# Patient Record
Sex: Female | Born: 1995 | Hispanic: Yes | Marital: Married | Smoking: Never smoker
Health system: Southern US, Community
[De-identification: ages and names within clinical notes are randomized; demographics above are authoritative.]

## PROBLEM LIST (undated history)

## (undated) DIAGNOSIS — J45909 Unspecified asthma, uncomplicated: Secondary | ICD-10-CM

---
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
## 2015-02-03 ENCOUNTER — Encounter (HOSPITAL_BASED_OUTPATIENT_CLINIC_OR_DEPARTMENT_OTHER): Payer: Self-pay | Admitting: *Deleted

## 2015-02-03 ENCOUNTER — Emergency Department (HOSPITAL_BASED_OUTPATIENT_CLINIC_OR_DEPARTMENT_OTHER)
Admission: EM | Admit: 2015-02-03 | Discharge: 2015-02-03 | Disposition: A | Discharge status: Home / Self Care | Payer: Medicaid Other | Attending: Emergency Medicine | Admitting: Emergency Medicine

## 2015-02-03 DIAGNOSIS — O23591 Infection of other part of genital tract in pregnancy, first trimester: Secondary | ICD-10-CM | POA: Diagnosis not present

## 2015-02-03 DIAGNOSIS — Z79899 Other long term (current) drug therapy: Secondary | ICD-10-CM | POA: Insufficient documentation

## 2015-02-03 DIAGNOSIS — O209 Hemorrhage in early pregnancy, unspecified: Secondary | ICD-10-CM | POA: Diagnosis present

## 2015-02-03 DIAGNOSIS — Z3A01 Less than 8 weeks gestation of pregnancy: Secondary | ICD-10-CM | POA: Insufficient documentation

## 2015-02-03 DIAGNOSIS — O200 Threatened abortion: Secondary | ICD-10-CM | POA: Insufficient documentation

## 2015-02-03 DIAGNOSIS — B9689 Other specified bacterial agents as the cause of diseases classified elsewhere: Secondary | ICD-10-CM

## 2015-02-03 DIAGNOSIS — N760 Acute vaginitis: Secondary | ICD-10-CM

## 2015-02-03 DIAGNOSIS — J45909 Unspecified asthma, uncomplicated: Secondary | ICD-10-CM | POA: Insufficient documentation

## 2015-02-03 DIAGNOSIS — O99511 Diseases of the respiratory system complicating pregnancy, first trimester: Secondary | ICD-10-CM | POA: Diagnosis not present

## 2015-02-03 LAB — WET PREP, GENITAL
Trich, Wet Prep: NONE SEEN
YEAST WET PREP: NONE SEEN

## 2015-02-03 LAB — URINE MICROSCOPIC-ADD ON

## 2015-02-03 LAB — URINALYSIS, ROUTINE W REFLEX MICROSCOPIC
Bilirubin Urine: NEGATIVE
Glucose, UA: NEGATIVE mg/dL
KETONES UR: NEGATIVE mg/dL
NITRITE: NEGATIVE
PROTEIN: NEGATIVE mg/dL
SPECIFIC GRAVITY, URINE: 1.018 (ref 1.005–1.030)
Urobilinogen, UA: 1 mg/dL (ref 0.0–1.0)
pH: 7 (ref 5.0–8.0)

## 2015-02-03 LAB — HCG, QUANTITATIVE, PREGNANCY: hCG, Beta Chain, Quant, S: 52843 m[IU]/mL — ABNORMAL HIGH (ref <5)

## 2015-02-03 LAB — PREGNANCY, URINE: PREG TEST UR: POSITIVE — AB

## 2015-02-03 MED ORDER — METRONIDAZOLE 500 MG PO TABS: 500.0000 mg | ORAL_TABLET | Freq: Two times a day (BID) | ORAL | Status: DC

## 2015-02-03 NOTE — ED Provider Notes (Signed)
CSN: [id number]     Arrival date & time 02/03/15  2014 History   First MD Initiated Contact with Patient 02/03/15 2024     Chief Complaint  Patient presents with  . Hematuria     (Consider location/radiation/quality/duration/timing/severity/associated sxs/prior Treatment) HPI Comments: 19 y/o G2P1 F c/o vaginal bleeding beginning at 5 PM today. Reports after urinating, when she wiped, she noticed a bright red clot on the toilet tissue. Shortly after, she took a shower, urinated again, noticed a darker clot on the toilet tissue. There has been no further vaginal bleeding, and it has only been noted when she wipes. LMP 12/22/2014. Reports she is approximately [redacted] weeks pregnant, took two home pregnancy tests and one at OB/GYN. Denies abdominal pain, fever, chills, nausea or vomiting.  The history is provided by the patient.    Past Medical History  Diagnosis Date  . Asthma   . Pregnancy    History reviewed. No pertinent past surgical history. No family history on file. History  Substance Use Topics  . Smoking status: Never Smoker   . Smokeless tobacco: Not on file  . Alcohol Use: No   OB History    Gravida Para Term Preterm AB TAB SAB Ectopic Multiple Living   1              Review of Systems  Genitourinary: Positive for vaginal bleeding.  All other systems reviewed and are negative.     Allergies  Review of patient's allergies indicates no known allergies.  Home Medications   Prior to Admission medications   Medication Sig Start Date End Date Taking? Authorizing Provider  acetaminophen (TYLENOL) 500 MG tablet Take 1 tablet (500 mg total) by mouth every 6 (six) hours as needed for pain. 03/11/13   Marcellina Millin, MD  albuterol (PROVENTIL HFA;VENTOLIN HFA) 108 (90 BASE) MCG/ACT inhaler Inhale 2 puffs into the lungs every 6 (six) hours as needed for wheezing.    Historical Provider, MD  metroNIDAZOLE (FLAGYL) 500 MG tablet Take 1 tablet (500 mg total) by mouth 2 (two) times  daily. One po bid x 7 days 02/03/15   Kathrynn Speed, PA-C  Prenatal Vit-Fe Fumarate-FA (PRENATAL MULTIVITAMIN) TABS Take 1 tablet by mouth daily at 12 noon.    Historical Provider, MD   BP 110/56 mmHg  Pulse 88  Temp(Src) 98.7 F (37.1 C) (Oral)  Resp 20  Ht  (1.575 m)  Wt 197 lb (89.359 kg)  BMI 36.02 kg/m2  SpO2 100% Physical Exam  Constitutional: She is oriented to person, place, and time. She appears well-developed and well-nourished. No distress.  HENT:  Head: Normocephalic and atraumatic.  Mouth/Throat: Oropharynx is clear and moist.  Eyes: Conjunctivae and EOM are normal.  Neck: Normal range of motion. Neck supple.  Cardiovascular: Normal rate, regular rhythm and normal heart sounds.   Pulmonary/Chest: Effort normal and breath sounds normal. No respiratory distress.  Abdominal: Soft. Bowel sounds are normal. She exhibits no distension. There is no tenderness.  Genitourinary: Cervix exhibits no motion tenderness. Right adnexum displays no tenderness. Left adnexum displays no tenderness.  Very small amount of dark red blood in vaginal vault. Cervical os closed.  Musculoskeletal: Normal range of motion. She exhibits no edema.  Neurological: She is alert and oriented to person, place, and time. No sensory deficit.  Skin: Skin is warm and dry.  Psychiatric: She has a normal mood and affect. Her behavior is normal.  Nursing note and vitals reviewed.   ED Course  Procedures (including critical care time) Labs Review Labs Reviewed  WET PREP, GENITAL - Abnormal; Notable for the following:    Clue Cells Wet Prep HPF POC MODERATE (*)    WBC, Wet Prep HPF POC MODERATE (*)    All other components within normal limits  URINALYSIS, ROUTINE W REFLEX MICROSCOPIC - Abnormal; Notable for the following:    Hgb urine dipstick MODERATE (*)    Leukocytes, UA MODERATE (*)    All other components within normal limits  PREGNANCY, URINE - Abnormal; Notable for the following:    Preg Test,  Ur POSITIVE (*)    All other components within normal limits  URINE MICROSCOPIC-ADD ON - Abnormal; Notable for the following:    Bacteria, UA FEW (*)    All other components within normal limits  URINE CULTURE  HCG, QUANTITATIVE, PREGNANCY  GC/CHLAMYDIA PROBE AMP ([place])    Imaging Review No results found.   EKG Interpretation None      MDM   Final diagnoses:  Threatened abortion  BV (bacterial vaginosis)   Non-toxic appearing, NAD. AFVSS. No associated abdominal pain. Abdomen soft and non-tender. Two episodes of small amount of blood. On exam, small amount of blood noted. Cervical os closed. Approx [redacted] weeks pregnant. Wet prep significant for clue cells. This is most likely the cause of WBC and leukocytes in urine. Urine culture pending. Will treat BV with flagyl. Regarding vaginal bleeding, pt will need to f/u with her ob/gyn for US, as at 4 weeks, most likely will not be conclusive. Discussed with Dr. Deretha EmoryZackowski who agrees. Discussed with pt that this is possibly a miscarriage, however bleeding can be coming from infection. Stable for d/c. Return precautions given. Patient states understanding of treatment care plan and is agreeable.  Kathrynn SpeedRobyn M Zayneb Baucum, PA-C 02/03/15 2139  Vanetta MuldersScott Zackowski, MD 02/07/15 [phone]

## 2015-02-03 NOTE — Discharge Instructions (Signed)
Take flagyl twice daily for 7 days. Continue prenatal vitamins. Call your OB/GYN tomorrow to schedule an appointment as soon as possible.  Bacterial Vaginosis Bacterial vaginosis is a vaginal infection that occurs when the normal balance of bacteria in the vagina is disrupted. It results from an overgrowth of certain bacteria. This is the most common vaginal infection in women of childbearing age. Treatment is important to prevent complications, especially in pregnant women, as it can cause a premature delivery. CAUSES  Bacterial vaginosis is caused by an increase in harmful bacteria that are normally present in smaller amounts in the vagina. Several different kinds of bacteria can cause bacterial vaginosis. However, the reason that the condition develops is not fully understood. RISK FACTORS Certain activities or behaviors can put you at an increased risk of developing bacterial vaginosis, including:  Having a new sex partner or multiple sex partners.  Douching.  Using an intrauterine device (IUD) for contraception. Women do not get bacterial vaginosis from toilet seats, bedding, swimming pools, or contact with objects around them. SIGNS AND SYMPTOMS  Some women with bacterial vaginosis have no signs or symptoms. Common symptoms include:  Grey vaginal discharge.  A fishlike odor with discharge, especially after sexual intercourse.  Itching or burning of the vagina and vulva.  Burning or pain with urination. DIAGNOSIS  Your health care provider will take a medical history and examine the vagina for signs of bacterial vaginosis. A sample of vaginal fluid may be taken. Your health care provider will look at this sample under a microscope to check for bacteria and abnormal cells. A vaginal pH test may also be done.  TREATMENT  Bacterial vaginosis may be treated with antibiotic medicines. These may be given in the form of a pill or a vaginal cream. A second round of antibiotics may be  prescribed if the condition comes back after treatment.  HOME CARE INSTRUCTIONS   Only take over-the-counter or prescription medicines as directed by your health care provider.  If antibiotic medicine was prescribed, take it as directed. Make sure you finish it even if you start to feel better.  Do not have sex until treatment is completed.  Tell all sexual partners that you have a vaginal infection. They should see their health care provider and be treated if they have problems, such as a mild rash or itching.  Practice safe sex by using condoms and only having one sex partner. SEEK MEDICAL CARE IF:   Your symptoms are not improving after 3 days of treatment.  You have increased discharge or pain.  You have a fever. MAKE SURE YOU:   Understand these instructions.  Will watch your condition.  Will get help right away if you are not doing well or get worse. FOR MORE INFORMATION  Centers for Disease Control and Prevention, Division of STD Prevention: [employer]s.co.za American Sexual Health Association (ASHA): www.ashastd.org  Document Released: 08/29/2005 Document Revised: 06/19/2013 Document Reviewed: 04/10/2013 [facility] Patient Information 2015 [place], [place]. This information is not intended to replace advice given to you by your health care provider. Make sure you discuss any questions you have with your health care provider.  Threatened Miscarriage A threatened miscarriage occurs when you have vaginal bleeding during your first 20 weeks of pregnancy but the pregnancy has not ended. If you have vaginal bleeding during this time, your health care provider will do tests to make sure you are still pregnant. If the tests show you are still pregnant and the developing baby (fetus) inside your  womb (uterus) is still growing, your condition is considered a threatened miscarriage. A threatened miscarriage does not mean your pregnancy will end, but it does increase the risk of losing your  pregnancy (complete miscarriage). CAUSES  The cause of a threatened miscarriage is usually not known. If you go on to have a complete miscarriage, the most common cause is an abnormal number of chromosomes in the developing baby. Chromosomes are the structures inside cells that hold all your genetic material. Some causes of vaginal bleeding that do not result in miscarriage include:  Having sex.  Having an infection.  Normal hormone changes of pregnancy.  Bleeding that occurs when an egg implants in your uterus. RISK FACTORS Risk factors for bleeding in early pregnancy include:  Obesity.  Smoking.  Drinking excessive amounts of alcohol or caffeine.  Recreational drug use. SIGNS AND SYMPTOMS  Light vaginal bleeding.  Mild abdominal pain or cramps. DIAGNOSIS  If you have bleeding with or without abdominal pain before 20 weeks of pregnancy, your health care provider will do tests to check whether you are still pregnant. One important test involves using sound waves and a computer (ultrasound) to create images of the inside of your uterus. Other tests include an internal exam of your vagina and uterus (pelvic exam) and measurement of your baby's heart rate.  You may be diagnosed with a threatened miscarriage if:  Ultrasound testing shows you are still pregnant.  Your baby's heart rate is strong.  A pelvic exam shows that the opening between your uterus and your vagina (cervix) is closed.  Your heart rate and blood pressure are stable.  Blood tests confirm you are still pregnant. TREATMENT  No treatments have been shown to prevent a threatened miscarriage from going on to a complete miscarriage. However, the right home care is important.  HOME CARE INSTRUCTIONS   Make sure you keep all your appointments for prenatal care. This is very important.  Get plenty of rest.  Do not have sex or use tampons if you have vaginal bleeding.  Do not douche.  Do not smoke or use  recreational drugs.  Do not drink alcohol.  Avoid caffeine. SEEK MEDICAL CARE IF:  You have light vaginal bleeding or spotting while pregnant.  You have abdominal pain or cramping.  You have a fever. SEEK IMMEDIATE MEDICAL CARE IF:  You have heavy vaginal bleeding.  You have blood clots coming from your vagina.  You have severe low back pain or abdominal cramps.  You have fever, chills, and severe abdominal pain. MAKE SURE YOU:  Understand these instructions.  Will watch your condition.  Will get help right away if you are not doing well or get worse. Document Released: 08/29/2005 Document Revised: 09/03/2013 Document Reviewed: 06/25/2013 [facility] Patient Information 2015 [place]. This information is not intended to replace advice given to you by your health care provider. Make sure you discuss any questions you have with your health care provider.

## 2015-02-03 NOTE — ED Notes (Signed)
Pt a/o ambulated to room, no acute distress noted. Hematuria starting today

## 2015-02-03 NOTE — ED Notes (Signed)
Hematuria this afternoon.

## 2015-02-04 LAB — GC/CHLAMYDIA PROBE AMP (~~LOC~~) NOT AT ARMC
Chlamydia: NEGATIVE
Neisseria Gonorrhea: NEGATIVE

## 2015-02-04 LAB — URINE CULTURE
COLONY COUNT: NO GROWTH
Culture: NO GROWTH

## 2016-02-03 ENCOUNTER — Other Ambulatory Visit: Payer: Self-pay | Admitting: Allergy

## 2016-02-03 MED ORDER — ALBUTEROL SULFATE HFA 108 (90 BASE) MCG/ACT IN AERS: 2.0000 | INHALATION_SPRAY | RESPIRATORY_TRACT | Status: DC | PRN

## 2016-02-20 ENCOUNTER — Encounter (HOSPITAL_BASED_OUTPATIENT_CLINIC_OR_DEPARTMENT_OTHER): Payer: Self-pay | Admitting: *Deleted

## 2016-02-20 ENCOUNTER — Emergency Department (HOSPITAL_BASED_OUTPATIENT_CLINIC_OR_DEPARTMENT_OTHER): Payer: Medicaid Other

## 2016-02-20 ENCOUNTER — Emergency Department (HOSPITAL_BASED_OUTPATIENT_CLINIC_OR_DEPARTMENT_OTHER)
Admission: EM | Admit: 2016-02-20 | Discharge: 2016-02-20 | Disposition: A | Discharge status: Home / Self Care | Payer: Medicaid Other | Attending: Emergency Medicine | Admitting: Emergency Medicine

## 2016-02-20 DIAGNOSIS — J45909 Unspecified asthma, uncomplicated: Secondary | ICD-10-CM | POA: Diagnosis not present

## 2016-02-20 DIAGNOSIS — R0602 Shortness of breath: Secondary | ICD-10-CM | POA: Insufficient documentation

## 2016-02-20 MED ORDER — ALBUTEROL SULFATE HFA 108 (90 BASE) MCG/ACT IN AERS
2.0000 | INHALATION_SPRAY | Freq: Once | RESPIRATORY_TRACT | Status: AC
Start: 2016-02-20 — End: 2016-02-20
  Administered 2016-02-20: 2 via RESPIRATORY_TRACT
  Filled 2016-02-20: qty 6.7

## 2016-02-20 MED ORDER — IPRATROPIUM-ALBUTEROL 0.5-2.5 (3) MG/3ML IN SOLN
3.0000 mL | RESPIRATORY_TRACT | Status: DC
  Administered 2016-02-20: 3 mL via RESPIRATORY_TRACT
  Filled 2016-02-20: qty 3

## 2016-02-20 NOTE — Discharge Instructions (Signed)
Shortness of Breath Ms. Kerri Patrick, your chest xray and EKG were normal today.  See your primary care doctor within 3 days for close follow up.  Use albuterol inhaler as needed. If symptoms worsen, come back to the ED immediately. Thank you. Shortness of breath means you have trouble breathing. Shortness of breath needs medical care right away. HOME CARE   Do not smoke.  Avoid being around chemicals or things (paint fumes, dust) that may bother your breathing.  Rest as needed. Slowly begin your normal activities.  Only take medicines as told by your doctor.  Keep all doctor visits as told. GET HELP RIGHT AWAY IF:   Your shortness of breath gets worse.  You feel lightheaded, pass out (faint), or have a cough that is not helped by medicine.  You cough up blood.  You have pain with breathing.  You have pain in your chest, arms, shoulders, or belly (abdomen).  You have a fever.  You cannot walk up stairs or exercise the way you normally do.  You do not get better in the time expected.  You have a hard time doing normal activities even with rest.  You have problems with your medicines.  You have any new symptoms. MAKE SURE YOU:  Understand these instructions.  Will watch your condition.  Will get help right away if you are not doing well or get worse.   This information is not intended to replace advice given to you by your health care provider. Make sure you discuss any questions you have with your health care provider.   Document Released: 02/15/2008 Document Revised: 09/03/2013 Document Reviewed: 11/14/2011 Elsevier Interactive Patient Education [employer]2016 Elsevier Inc.

## 2016-02-20 NOTE — ED Provider Notes (Signed)
CSN: [id number]     Arrival date & time 02/20/16  0037 History   First MD Initiated Contact with Patient 02/20/16 0139     Chief Complaint  Patient presents with  . Shortness of Breath     (Consider location/radiation/quality/duration/timing/severity/associated sxs/prior Treatment) HPI   Kerri Patrick is a 20 y.o. female with past medical history of asthma presenting today for shortness of breath. Patient states this is been going on for the past 3-4 days. She describes worsening shortness of breath when she stands up. She denies any pain anywhere. She denies any recent infections, fever, rhinorrhea, or cough. She does have 2 children home but no sick contacts. She states her albuterol inhaler does not feel like it is working anymore. She denies any history of blood clots, lower extremity swelling or pain, long distance travel, estrogen use, recent surgeries. There are no further complaints.  10 Systems reviewed and are negative for acute change except as noted in the HPI.     Past Medical History  Diagnosis Date  . Asthma   . Pregnancy    History reviewed. No pertinent past surgical history. History reviewed. No pertinent family history. Social History  Substance Use Topics  . Smoking status: Never Smoker   . Smokeless tobacco: None  . Alcohol Use: No   OB History    Gravida Para Term Preterm AB TAB SAB Ectopic Multiple Living   1              Review of Systems    Allergies  Review of patient's allergies indicates no known allergies.  Home Medications   Prior to Admission medications   Medication Sig Start Date End Date Taking? Authorizing Provider  albuterol (PROAIR HFA) 108 (90 Base) MCG/ACT inhaler Inhale 2 puffs into the lungs every 4 (four) hours as needed for wheezing or shortness of breath. 02/03/16   Fletcher Anon, MD  albuterol (PROVENTIL HFA;VENTOLIN HFA) 108 (90 BASE) MCG/ACT inhaler Inhale 2 puffs into the lungs every 6 (six) hours as needed for  wheezing.    Historical Provider, MD   BP 117/71 mmHg  Pulse 75  Temp(Src) 97.8 F (36.6 C) (Oral)  Resp 18  Ht  (1.575 m)  Wt 185 lb (83.915 kg)  BMI 33.83 kg/m2  SpO2 100%  LMP 02/11/2016  Breastfeeding? Unknown Physical Exam  Constitutional: She is oriented to person, place, and time. She appears well-developed and well-nourished. No distress.  HENT:  Head: Normocephalic and atraumatic.  Nose: Nose normal.  Mouth/Throat: Oropharynx is clear and moist. No oropharyngeal exudate.  Eyes: Conjunctivae and EOM are normal. Pupils are equal, round, and reactive to light. No scleral icterus.  Neck: Normal range of motion. Neck supple. No JVD present. No tracheal deviation present. No thyromegaly present.  Cardiovascular: Normal rate, regular rhythm and normal heart sounds.  Exam reveals no gallop and no friction rub.   No murmur heard. Pulmonary/Chest: Effort normal and breath sounds normal. No respiratory distress. She has no wheezes. She exhibits no tenderness.  Abdominal: Soft. Bowel sounds are normal. She exhibits no distension and no mass. There is no tenderness. There is no rebound and no guarding.  Musculoskeletal: Normal range of motion. She exhibits no edema or tenderness.  Lymphadenopathy:    She has no cervical adenopathy.  Neurological: She is alert and oriented to person, place, and time. No cranial nerve deficit. She exhibits normal muscle tone.  Skin: Skin is warm and dry. No rash noted. No erythema. No  pallor.  Nursing note and vitals reviewed.   ED Course  Procedures (including critical care time) Labs Review Labs Reviewed - No data to display  Imaging Review Dg Chest 2 View  02/20/2016  CLINICAL DATA:  Acute onset of shortness of breath. Initial encounter. EXAM: CHEST  2 VIEW COMPARISON:  Chest radiograph performed 12/30/2012 FINDINGS: The lungs are well-aerated. Mild vascular congestion is noted. Mild bibasilar atelectasis is seen. There is no evidence of  pleural effusion or pneumothorax. The heart is normal in size; the mediastinal contour is within normal limits. No acute osseous abnormalities are seen. IMPRESSION: Mild vascular congestion noted.  Mild bibasilar atelectasis seen. Electronically Signed   By: Roanna RaiderJeffery  Chang M.D.   On: 02/20/2016 02:56   I have personally reviewed and evaluated these images and lab results as part of my medical decision-making.   EKG Interpretation   Date/Time:  Saturday February 20 2016 02:10:01 EDT Ventricular Rate:  67 PR Interval:  174 QRS Duration: 86 QT Interval:  436 QTC Calculation: 460 R Axis:   14 Text Interpretation:  Sinus rhythm RSR' in V1 or V2, probably normal  variant No old tracing to compare Confirmed by Erroll Lunani, Hetal Proano Ayokunle  [phone]([id number]) on 02/20/2016 2:33:23 AM      MDM   Final diagnoses:  SOB (shortness of breath)    Patient presents emergency department for shortness of breath. Chest x-ray does not show any cause for her symptoms. EKG is unremarkable. She was given DuoNeb treatment in the emergency department for her symptoms.   3:02 AM upon repeat evaluation, patient states his shortness of breath has improved. Will discharge home with new albuterol inhaler. She appears well and in no acute distress, vital signs within her normal limits and she is safe for discharge and primary care follow-up within 3 days.    Tomasita CrumbleAdeleke Francys Bolin, MD 02/20/16 [phone]

## 2016-02-20 NOTE — ED Notes (Signed)
Pt c/o SOB x 3 days  hx asthma

## 2016-06-05 ENCOUNTER — Encounter (HOSPITAL_BASED_OUTPATIENT_CLINIC_OR_DEPARTMENT_OTHER): Payer: Self-pay | Admitting: Emergency Medicine

## 2016-06-05 ENCOUNTER — Emergency Department (HOSPITAL_BASED_OUTPATIENT_CLINIC_OR_DEPARTMENT_OTHER)
Admission: EM | Admit: 2016-06-05 | Discharge: 2016-06-05 | Disposition: A | Discharge status: Home / Self Care | Payer: Medicaid Other | Attending: Emergency Medicine | Admitting: Emergency Medicine

## 2016-06-05 DIAGNOSIS — J45909 Unspecified asthma, uncomplicated: Secondary | ICD-10-CM | POA: Insufficient documentation

## 2016-06-05 DIAGNOSIS — R0602 Shortness of breath: Secondary | ICD-10-CM | POA: Diagnosis not present

## 2016-06-05 DIAGNOSIS — R079 Chest pain, unspecified: Secondary | ICD-10-CM | POA: Diagnosis present

## 2016-06-05 DIAGNOSIS — R1010 Upper abdominal pain, unspecified: Secondary | ICD-10-CM | POA: Insufficient documentation

## 2016-06-05 DIAGNOSIS — Z7951 Long term (current) use of inhaled steroids: Secondary | ICD-10-CM | POA: Insufficient documentation

## 2016-06-05 NOTE — Discharge Instructions (Signed)
Summary this pain could be biliary colic. Follow-up with the surgeon as needed. Return for continued pain or pain that localizes to the right upper abdomen.

## 2016-06-05 NOTE — ED Provider Notes (Signed)
[facility]   CSN: [id number] Arrival date & time: 06/05/16  1528  By signing my name below, I, Phillis Haggis, attest that this documentation has been prepared under the direction and in the presence of Benjiman Core, MD. Electronically Signed: Phillis Haggis, ED Scribe. 06/05/16. 4:48 PM.  History   Chief Complaint Chief Complaint  Patient presents with  . Chest Pain    x1 week   The history is provided by the patient. No language interpreter was used.   HPI Comments: Scheryl Sanborn is a 20 y.o. female with a hx of asthma who presents to the Emergency Department complaining of intermittent, sharp, central chest pain that radiates to bilateral upper abdomen onset one week ago. Pt says that pain lasts about 10 minutes at a time. She reports associated SOB for which she has been using her inhaler to relief. Pt has gallstones and does not know if the pain is related. She denies worsening or alleviating factors. She denies diarrhea, constipation, or dysuria. Pt denies chance of pregnancy.  Past Medical History:  Diagnosis Date  . Asthma   . Pregnancy    There are no active problems to display for this patient.  Past Surgical History:  Procedure Laterality Date  . CESAREAN SECTION     x2    OB History    Gravida Para Term Preterm AB Living   1             SAB TAB Ectopic Multiple Live Births                   Home Medications    Prior to Admission medications   Medication Sig Start Date End Date Taking? Authorizing Provider  albuterol (PROAIR HFA) 108 (90 Base) MCG/ACT inhaler Inhale 2 puffs into the lungs every 4 (four) hours as needed for wheezing or shortness of breath. 02/03/16  Yes Fletcher Anon, MD  albuterol (PROVENTIL HFA;VENTOLIN HFA) 108 (90 BASE) MCG/ACT inhaler Inhale 2 puffs into the lungs every 6 (six) hours as needed for wheezing.    Historical Provider, MD    Family History History reviewed. No pertinent family  history.  Social History Social History  Substance Use Topics  . Smoking status: Never Smoker  . Smokeless tobacco: Never Used  . Alcohol use No     Allergies   Review of patient's allergies indicates no known allergies.  Review of Systems Review of Systems  Respiratory: Positive for shortness of breath.   Cardiovascular: Positive for chest pain.  Gastrointestinal: Negative for constipation and diarrhea.  Genitourinary: Negative for dysuria.  All other systems reviewed and are negative.  Physical Exam Updated Vital Signs BP (!) 110/52 (BP Location: Left Arm)   Pulse 84   Temp 98.4 F (36.9 C) (Oral)   Resp 18   Ht 5\' 2"  (1.575 m)   Wt 195 lb 12.8 oz (88.8 kg)   LMP 05/16/2016 (Approximate)   SpO2 100%   Breastfeeding? No   BMI 35.81 kg/m   Physical Exam  Constitutional: She is oriented to person, place, and time. She appears well-developed and well-nourished.  HENT:  Head: Normocephalic and atraumatic.  Eyes: EOM are normal. Pupils are equal, round, and reactive to light.  Neck: Normal range of motion. Neck supple.  Cardiovascular: Normal rate, regular rhythm and normal heart sounds.  Exam reveals no gallop and no friction rub.   No murmur heard. Pulmonary/Chest: Effort normal and breath sounds normal. She has no wheezes.  She exhibits no tenderness.  Abdominal: Soft. There is no tenderness.  Musculoskeletal: Normal range of motion.  Neurological: She is alert and oriented to person, place, and time.  Skin: Skin is warm and dry.  Psychiatric: She has a normal mood and affect. Her behavior is normal.  Nursing note and vitals reviewed.  ED Treatments / Results  DIAGNOSTIC STUDIES: Oxygen Saturation is 100% on RA, normal by my interpretation.    COORDINATION OF CARE: 4:45 PM-Discussed treatment plan which includes EKG with pt at bedside and pt agreed to plan.    Labs (all labs ordered are listed, but only abnormal results are displayed) Labs Reviewed - No  data to display  EKG  EKG Interpretation  Date/Time:  Sunday June 05 2016 15:46:05 EDT Ventricular Rate:  88 PR Interval:  150 QRS Duration: 68 QT Interval:  354 QTC Calculation: 428 R Axis:   7 Text Interpretation:  Normal sinus rhythm Low voltage QRS Abnormal ECG No significant change since last tracing Confirmed by Rubin PayorPICKERING  MD, Harrold DonathNATHAN [phone]([id number]) on 06/05/2016 4:38:39 PM       Radiology No results found.  Procedures Procedures (including critical care time)  Medications Ordered in ED Medications - No data to display   Initial Impression / Assessment and Plan / ED Course  I have reviewed the triage vital signs and the nursing notes.  Pertinent labs & imaging results that were available during my care of the patient were reviewed by me and considered in my medical decision making (see chart for details).  Clinical Course    Patient with upper abdominal pain that then goes to both flanks. States she has known gallstones. Does not appear to be acute cholecystitis or pancreatitis this time. No persistent tenderness or pain at this time. Lungs are clear no chest wall tenderness. Potentially could be biliary colic but not associated with eating. No GI symptoms otherwise. Denies possibility of pregnancy. Will discharge home to follow-up with general surgery.  Final Clinical Impressions(s) / ED Diagnoses   Final diagnoses:  Pain of upper abdomen  I personally performed the services described in this documentation, which was scribed in my presence. The recorded information has been reviewed and is accurate.     New Prescriptions New Prescriptions   No medications on file     Benjiman CoreNathan Ofelia Podolski, MD 06/05/16 1651

## 2016-06-05 NOTE — ED Triage Notes (Signed)
Patient reports she has gallstones. C/o chest pain that starts in her central chest and radiates into her sides. Patient reports she is often SOB with this pain, uses her inhalers which resolves the SOB. Patient states this pain has been going on intermittently for 1 week, nothing she does makes the pain better or worse. Patient is A&Ox4, NAD noted, speaking in complete sentences.

## 2016-06-26 ENCOUNTER — Encounter (HOSPITAL_BASED_OUTPATIENT_CLINIC_OR_DEPARTMENT_OTHER): Payer: Self-pay | Admitting: Emergency Medicine

## 2016-06-26 ENCOUNTER — Emergency Department (HOSPITAL_BASED_OUTPATIENT_CLINIC_OR_DEPARTMENT_OTHER)
Admission: EM | Admit: 2016-06-26 | Discharge: 2016-06-27 | Disposition: A | Discharge status: Home / Self Care | Payer: Medicaid Other | Attending: Emergency Medicine | Admitting: Emergency Medicine

## 2016-06-26 DIAGNOSIS — R519 Headache, unspecified: Secondary | ICD-10-CM

## 2016-06-26 DIAGNOSIS — J45909 Unspecified asthma, uncomplicated: Secondary | ICD-10-CM | POA: Insufficient documentation

## 2016-06-26 DIAGNOSIS — R51 Headache: Secondary | ICD-10-CM | POA: Insufficient documentation

## 2016-06-26 LAB — PREGNANCY, URINE: PREG TEST UR: NEGATIVE

## 2016-06-26 MED ORDER — NAPROXEN 375 MG PO TABS: 375.0000 mg | ORAL_TABLET | Freq: Two times a day (BID) | ORAL | 0 refills | Status: DC

## 2016-06-26 MED ORDER — NAPROXEN 250 MG PO TABS
500.0000 mg | ORAL_TABLET | Freq: Once | ORAL | Status: AC
  Administered 2016-06-26: 500 mg via ORAL
  Filled 2016-06-26: qty 2

## 2016-06-26 MED ORDER — IBUPROFEN 800 MG PO TABS
800.0000 mg | ORAL_TABLET | Freq: Once | ORAL | Status: DC
  Filled 2016-06-26: qty 1

## 2016-06-26 MED ORDER — METOCLOPRAMIDE HCL 10 MG PO TABS
10.0000 mg | ORAL_TABLET | Freq: Once | ORAL | Status: AC
  Administered 2016-06-26: 10 mg via ORAL
  Filled 2016-06-26: qty 1

## 2016-06-26 NOTE — ED Provider Notes (Signed)
[facility]   CSN: [id number] Arrival date & time: 06/26/16  2140  By signing my name below, I, Rosario AdieWilliam Andrew Hiatt, attest that this documentation has been prepared under the direction and in the presence of Kharter Brew, MD. Electronically Signed: Rosario AdieWilliam Andrew Hiatt, ED Scribe. 06/26/16. 11:16 PM.  History   Chief Complaint Chief Complaint  Patient presents with  . Headache   The history is provided by the patient. No language interpreter was used.  Headache   This is a new problem. The current episode started 2 days ago. The problem occurs constantly. Progression since onset: waxing and waning. The headache is associated with nothing. The pain is located in the temporal region. The quality of the pain is described as dull. The pain is at a severity of 6/10. The pain is moderate. The pain does not radiate. Pertinent negatives include no anorexia, no fever, no malaise/fatigue, no chest pressure, no near-syncope, no orthopnea, no palpitations, no syncope, no shortness of breath, no nausea and no vomiting. She has tried nothing for the symptoms. The treatment provided no relief.  Not sudden onset not the worst HA of her life.  No changes in vision or cognition HPI Comments: Kerri LawlessYasmine Patrick is a 20 y.o. female with a PMHx of asthma. who presents to the Emergency Department complaining of gradual onset, constant, waxing and waning, parietal headache onset ~2 days ago. She describes her HA as sharp. No treatments were tried prior to coming into the ED for her pain. No exacerbating factors noted. Pt states that she has a h/o of occasional headaches which always resolve and feel similar; however, she states that this headache will not resolve. Denies nausea, vomiting, visual disturbance, numbness, weakness, rash, congestion, back pain, neck pain, or any other associated symptoms.   Past Medical History:  Diagnosis Date  . Asthma   . Pregnancy    There are no active  problems to display for this patient.  Past Surgical History:  Procedure Laterality Date  . CESAREAN SECTION     x2   OB History    Gravida Para Term Preterm AB Living   1             SAB TAB Ectopic Multiple Live Births                 Home Medications    Prior to Admission medications   Medication Sig Start Date End Date Taking? Authorizing Provider  albuterol (PROAIR HFA) 108 (90 Base) MCG/ACT inhaler Inhale 2 puffs into the lungs every 4 (four) hours as needed for wheezing or shortness of breath. 02/03/16   Fletcher AnonJose A Bardelas, MD  albuterol (PROVENTIL HFA;VENTOLIN HFA) 108 (90 BASE) MCG/ACT inhaler Inhale 2 puffs into the lungs every 6 (six) hours as needed for wheezing.    Historical Provider, MD   Family History History reviewed. No pertinent family history.  Social History Social History  Substance Use Topics  . Smoking status: Never Smoker  . Smokeless tobacco: Never Used  . Alcohol use No   Allergies   Review of patient's allergies indicates no known allergies.  Review of Systems Review of Systems  Constitutional: Negative for fever and malaise/fatigue.  HENT: Negative for congestion.   Eyes: Negative for photophobia and visual disturbance.  Respiratory: Negative for shortness of breath.   Cardiovascular: Negative for palpitations, orthopnea, syncope and near-syncope.  Gastrointestinal: Negative for anorexia, nausea and vomiting.  Musculoskeletal: Negative for back pain, neck pain and neck  stiffness.  Skin: Negative for rash.  Neurological: Positive for headaches. Negative for dizziness, seizures, syncope, facial asymmetry, speech difficulty, weakness, light-headedness and numbness.  All other systems reviewed and are negative.  Physical Exam Updated Vital Signs BP 124/71 (BP Location: Right Arm)   Temp 98.1 F (36.7 C) (Oral)   Resp 18   Ht 5\' 2"  (1.575 m)   Wt 196 lb 3.2 oz (89 kg)   LMP 06/15/2016   SpO2 100%   BMI 35.89 kg/m   Physical Exam    Constitutional: She is oriented to person, place, and time. She appears well-developed and well-nourished. No distress.  Resting comfortably in the room with lights on  HENT:  Head: Normocephalic and atraumatic.  Mouth/Throat: Oropharynx is clear and moist. No oropharyngeal exudate.  No bulges over the temporal arteries.   Eyes: Conjunctivae and EOM are normal. Pupils are equal, round, and reactive to light. Right eye exhibits no discharge. Left eye exhibits no discharge. No scleral icterus.  Neck: Normal range of motion. Neck supple. No JVD present. No tracheal deviation present.  Trachea is midline. No stridor or carotid bruits.   Cardiovascular: Normal rate, regular rhythm, normal heart sounds and intact distal pulses.   No murmur heard. Pulmonary/Chest: Effort normal and breath sounds normal. No stridor. No respiratory distress. She has no wheezes. She has no rales.  Lungs CTA bilaterally.  Abdominal: Soft. Bowel sounds are normal. She exhibits no distension. There is no tenderness. There is no rebound and no guarding.  Musculoskeletal: Normal range of motion. She exhibits no edema or tenderness.  All compartments are soft. No palpable cords.   Lymphadenopathy:    She has no cervical adenopathy.  Neurological: She is alert and oriented to person, place, and time. She has normal reflexes. She displays normal reflexes. No cranial nerve deficit. She exhibits normal muscle tone. Coordination normal.  Cranial nerves 2-12 grossly intact. Normal finger to nose testing.   Skin: Skin is warm and dry. Capillary refill takes less than 2 seconds.  Psychiatric: She has a normal mood and affect. Her behavior is normal.  Nursing note and vitals reviewed.  ED Treatments / Results  DIAGNOSTIC STUDIES: Oxygen Saturation is 100% on RA, normal by my interpretation.   Vitals:   06/26/16 2146 06/26/16 2300  BP: 124/71 101/56  Pulse:  74  Resp: 18 16  Temp: 98.1 F (36.7 C)    Medications   metoCLOPramide (REGLAN) tablet 10 mg (10 mg Oral Given 06/26/16 2327)  naproxen (NAPROSYN) tablet 500 mg (500 mg Oral Given 06/26/16 2327)     Procedures Procedures     Final Clinical Impressions(s) / ED Diagnoses  Headache likely tension type. No proptosis no changes in vision speech or cognition.  No fevers, neck pain nor rashes.  Very well appearing.  I highly doubt acute intracranial pathology.  Patient does not require imaging at this time.  All questions answered to patient's satisfaction. Based on history and exam patient has been appropriately medically screened and emergency conditions excluded. Patient is stable for discharge at this time. Follow up with your PMD for recheck in 2 days and strict return precautions given.  I personally performed the services described in this documentation, which was scribed in my presence. The recorded information has been reviewed and is accurate.       Cy Blamer, MD 06/27/16 [phone]

## 2016-06-26 NOTE — ED Notes (Signed)
Pt is in good condition, discharge instructions reviewed, prescription medication reviewed, patient verbalized understanding; patient is ambulatory, going home with friend

## 2016-06-26 NOTE — ED Triage Notes (Signed)
Patient reports that she is having sharp shooting pains to her head. The patient reports that it kept her up last night

## 2016-06-27 ENCOUNTER — Encounter (HOSPITAL_BASED_OUTPATIENT_CLINIC_OR_DEPARTMENT_OTHER): Payer: Self-pay | Admitting: Emergency Medicine

## 2016-08-11 ENCOUNTER — Emergency Department (HOSPITAL_BASED_OUTPATIENT_CLINIC_OR_DEPARTMENT_OTHER)
Admission: EM | Admit: 2016-08-11 | Discharge: 2016-08-11 | Disposition: A | Discharge status: Home / Self Care | Payer: Medicaid Other | Attending: Dermatology | Admitting: Dermatology

## 2016-08-11 DIAGNOSIS — R0602 Shortness of breath: Secondary | ICD-10-CM | POA: Insufficient documentation

## 2016-08-11 DIAGNOSIS — Z791 Long term (current) use of non-steroidal anti-inflammatories (NSAID): Secondary | ICD-10-CM | POA: Insufficient documentation

## 2016-08-11 DIAGNOSIS — Z5321 Procedure and treatment not carried out due to patient leaving prior to being seen by health care provider: Secondary | ICD-10-CM | POA: Insufficient documentation

## 2016-08-11 DIAGNOSIS — J45909 Unspecified asthma, uncomplicated: Secondary | ICD-10-CM | POA: Diagnosis not present

## 2016-08-11 MED ORDER — ALBUTEROL SULFATE HFA 108 (90 BASE) MCG/ACT IN AERS
2.0000 | INHALATION_SPRAY | Freq: Once | RESPIRATORY_TRACT | Status: AC
  Administered 2016-08-11: 2 via RESPIRATORY_TRACT
  Filled 2016-08-11: qty 6.7

## 2016-08-11 NOTE — ED Triage Notes (Signed)
Sob x 2 weeks. Cold started yesterday. She has been using her inhaler with an aero chamber but she does not like how it works. She is ambulatory. No distress. Speaking in complete sentences.

## 2016-08-15 ENCOUNTER — Ambulatory Visit (INDEPENDENT_AMBULATORY_CARE_PROVIDER_SITE_OTHER): Payer: Medicaid Other | Admitting: Pediatrics

## 2016-08-15 ENCOUNTER — Encounter: Payer: Self-pay | Admitting: Pediatrics

## 2016-08-15 VITALS — BP 124/80 | HR 64 | Temp 98.5°F | Resp 16 | Ht 61.61 in | Wt 198.4 lb

## 2016-08-15 DIAGNOSIS — J3089 Other allergic rhinitis: Secondary | ICD-10-CM | POA: Insufficient documentation

## 2016-08-15 DIAGNOSIS — J4530 Mild persistent asthma, uncomplicated: Secondary | ICD-10-CM | POA: Diagnosis not present

## 2016-08-15 MED ORDER — ALBUTEROL SULFATE HFA 108 (90 BASE) MCG/ACT IN AERS
2.0000 | INHALATION_SPRAY | RESPIRATORY_TRACT | 1 refills | Status: DC | PRN
Start: 2016-08-15 — End: 2016-12-09

## 2016-08-15 NOTE — Progress Notes (Signed)
  [address] [place] [address] Dept: [phone]  FOLLOW UP NOTE  Patient ID: Kerri LawlessYasmine Patrick, female    DOB: [birth year]  Age: 20 y.o. MRN: [id number] Date of Office Visit: 08/15/2016  Assessment  Chief Complaint: Allergic Rhinitis ; Asthma; Cough (intermittently x's 2 weeks with some shortness of breath); and Wheezing (intermittently x's 2 weeks)  HPI Kerri Patrick presents for follow-up of asthma and allergic rhinitis. She has been in the past allergic to grass pollens, weeds, tree pollens, dust mites, cat and cockroach. She delivered a normal infant last year. She was seen in the emergency room recently because of some coughing and wheezing and a sore throat. This began about 2 weeks ago and a sore throat has improved.  Current medications Proventil 2 puffs every 4 hours if needed.   Drug Allergies:  Allergies  Allergen Reactions  . Shellfish Allergy     Physical Exam: BP 124/80 (BP Location: Right Arm, Patient Position: Sitting, Cuff Size: Normal)   Pulse 64   Temp 98.5 F (36.9 C) (Oral)   Resp 16   Ht 5' 1.61" (1.565 m)   Wt 198 lb 6.6 oz (90 kg)   BMI 36.75 kg/m    Physical Exam  Constitutional: She is oriented to person, place, and time. She appears well-developed and well-nourished.  HENT:  Eyes normal. Ears normal. Nose normal. Pharynx normal.  Neck: Neck supple.  Cardiovascular:  S1 and S2 normal no murmurs  Pulmonary/Chest:  Clear to percussion and auscultation  Lymphadenopathy:    She has no cervical adenopathy.  Neurological: She is alert and oriented to person, place, and time.  Psychiatric: She has a normal mood and affect. Her behavior is normal. Judgment and thought content normal.  Vitals reviewed.   Diagnostics: FVC 3.59 L FEV1 3.13 L. Predicted FVC 3.60 L predicted FEV1 3.17 L-the spirometry is in the normal range   Assessment and Plan: 1. Mild persistent asthma without complication   2. Other allergic rhinitis     Meds ordered  this encounter  Medications  . albuterol (PROVENTIL HFA) 108 (90 Base) MCG/ACT inhaler    Sig: Inhale 2 puffs into the lungs every 4 (four) hours as needed for wheezing or shortness of breath.    Dispense:  1 Inhaler    Refill:  1    Please keep rx on file. Pt. Will call when needed    Patient Instructions  Zyrtec 10 mg once a day if needed for runny nose Rhinocort or Nasacort 2 sprays per nostril once a day if needed for stuffy nose-buy  over-the-counter Proventil 2 puffs every 4 hours if needed for wheezing or coughing spells Add prednisone 10 mg twice a day for 4 days, 10 mg on the fifth day You should have a flu vaccination Call me if you're not doing better on this treatment plan   Return if symptoms worsen or fail to improve.    Thank you for the opportunity to care for this patient.  Please do not hesitate to contact me with questions.  Tonette BihariJ. A. Daquann Merriott, M.D.  [facility] [address] [place] [address] [phone]

## 2016-08-15 NOTE — Patient Instructions (Addendum)
Zyrtec 10 mg once a day if needed for runny nose Rhinocort or Nasacort 2 sprays per nostril once a day if needed for stuffy nose-buy  over-the-counter Proventil 2 puffs every 4 hours if needed for wheezing or coughing spells Add prednisone 10 mg twice a day for 4 days, 10 mg on the fifth day You should have a flu vaccination Call me if you're not doing better on this treatment plan

## 2016-12-05 IMAGING — CR DG CHEST 2V
2 series · 2 of 2 positions shown · non-contrast
Comparison: Chest radiograph performed 12/30/2012

CLINICAL DATA: Acute onset of shortness of breath. Initial
encounter.

EXAM:
CHEST  2 VIEW

[w chest pa]
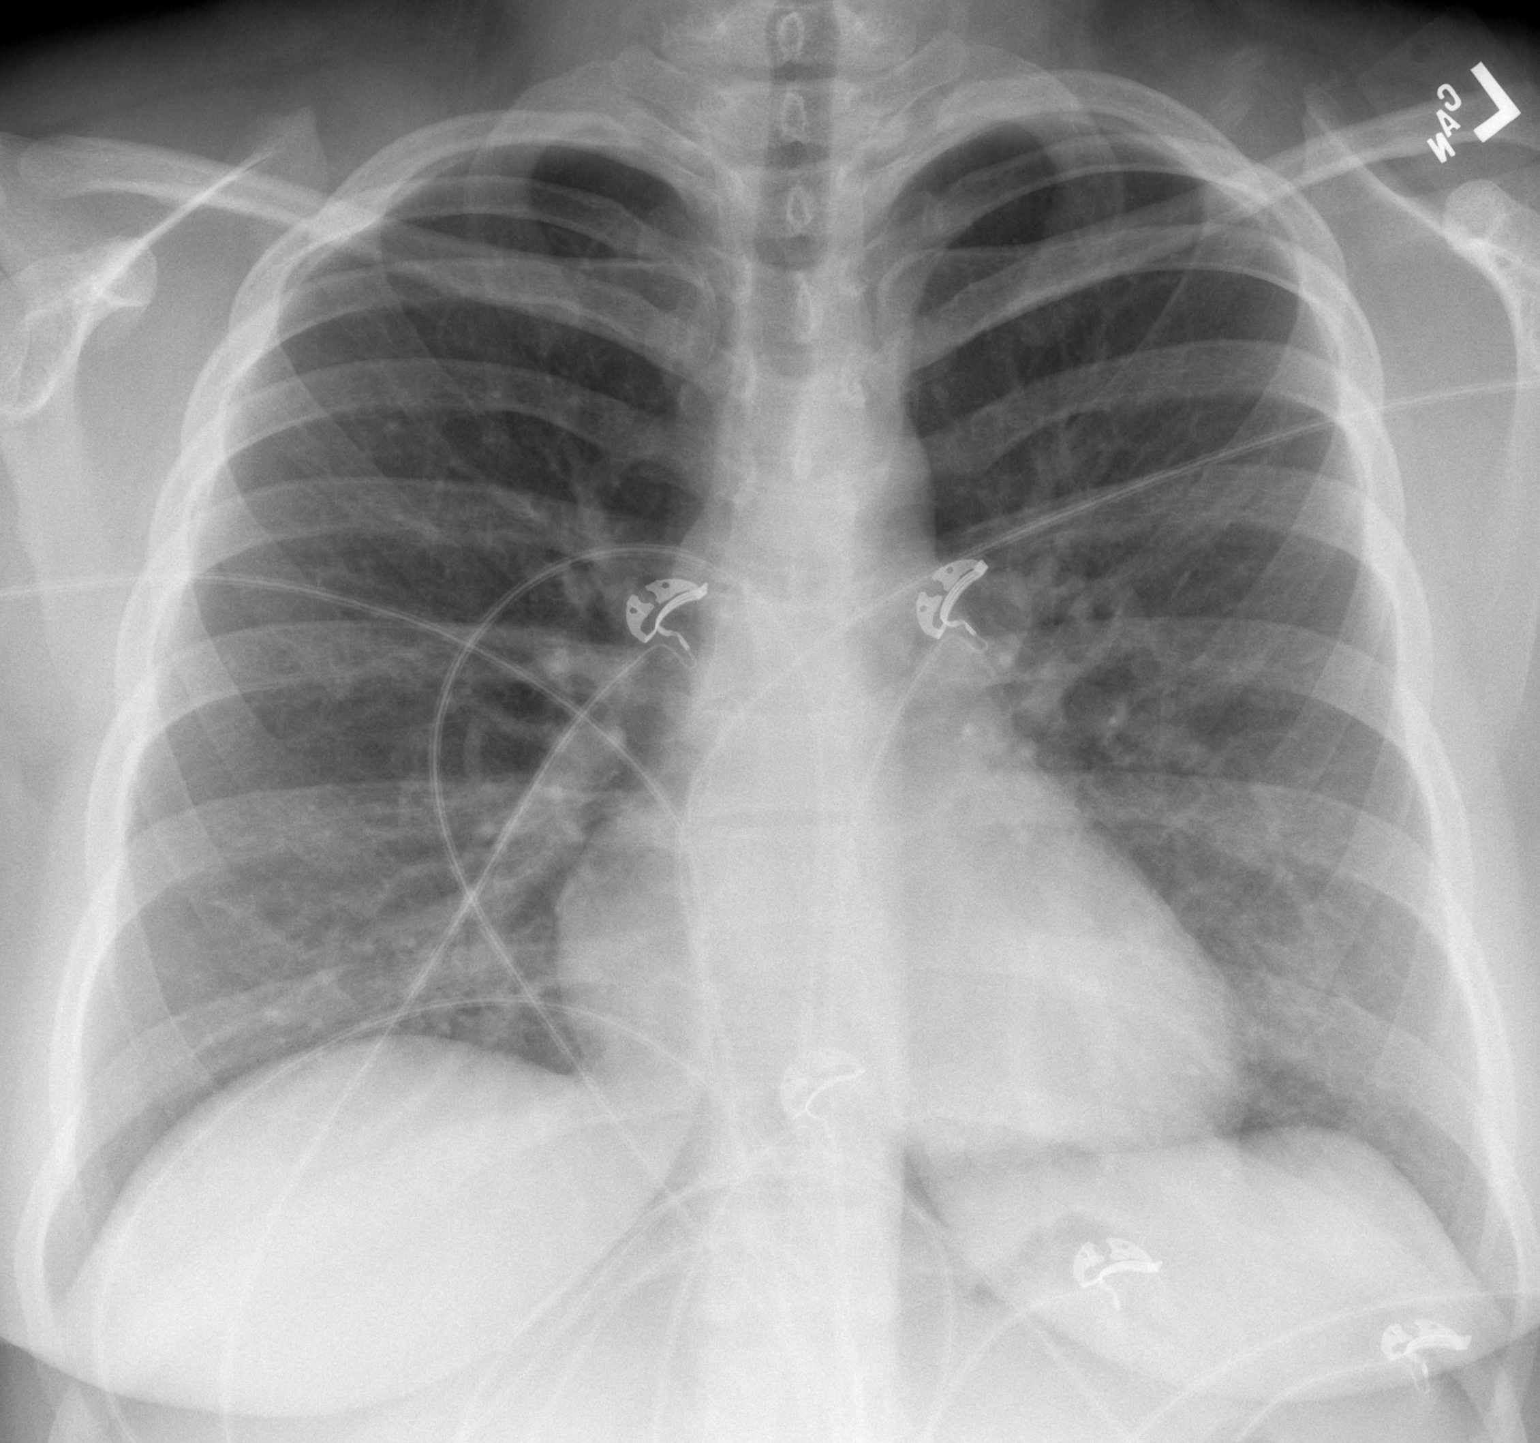

[w chest lat]
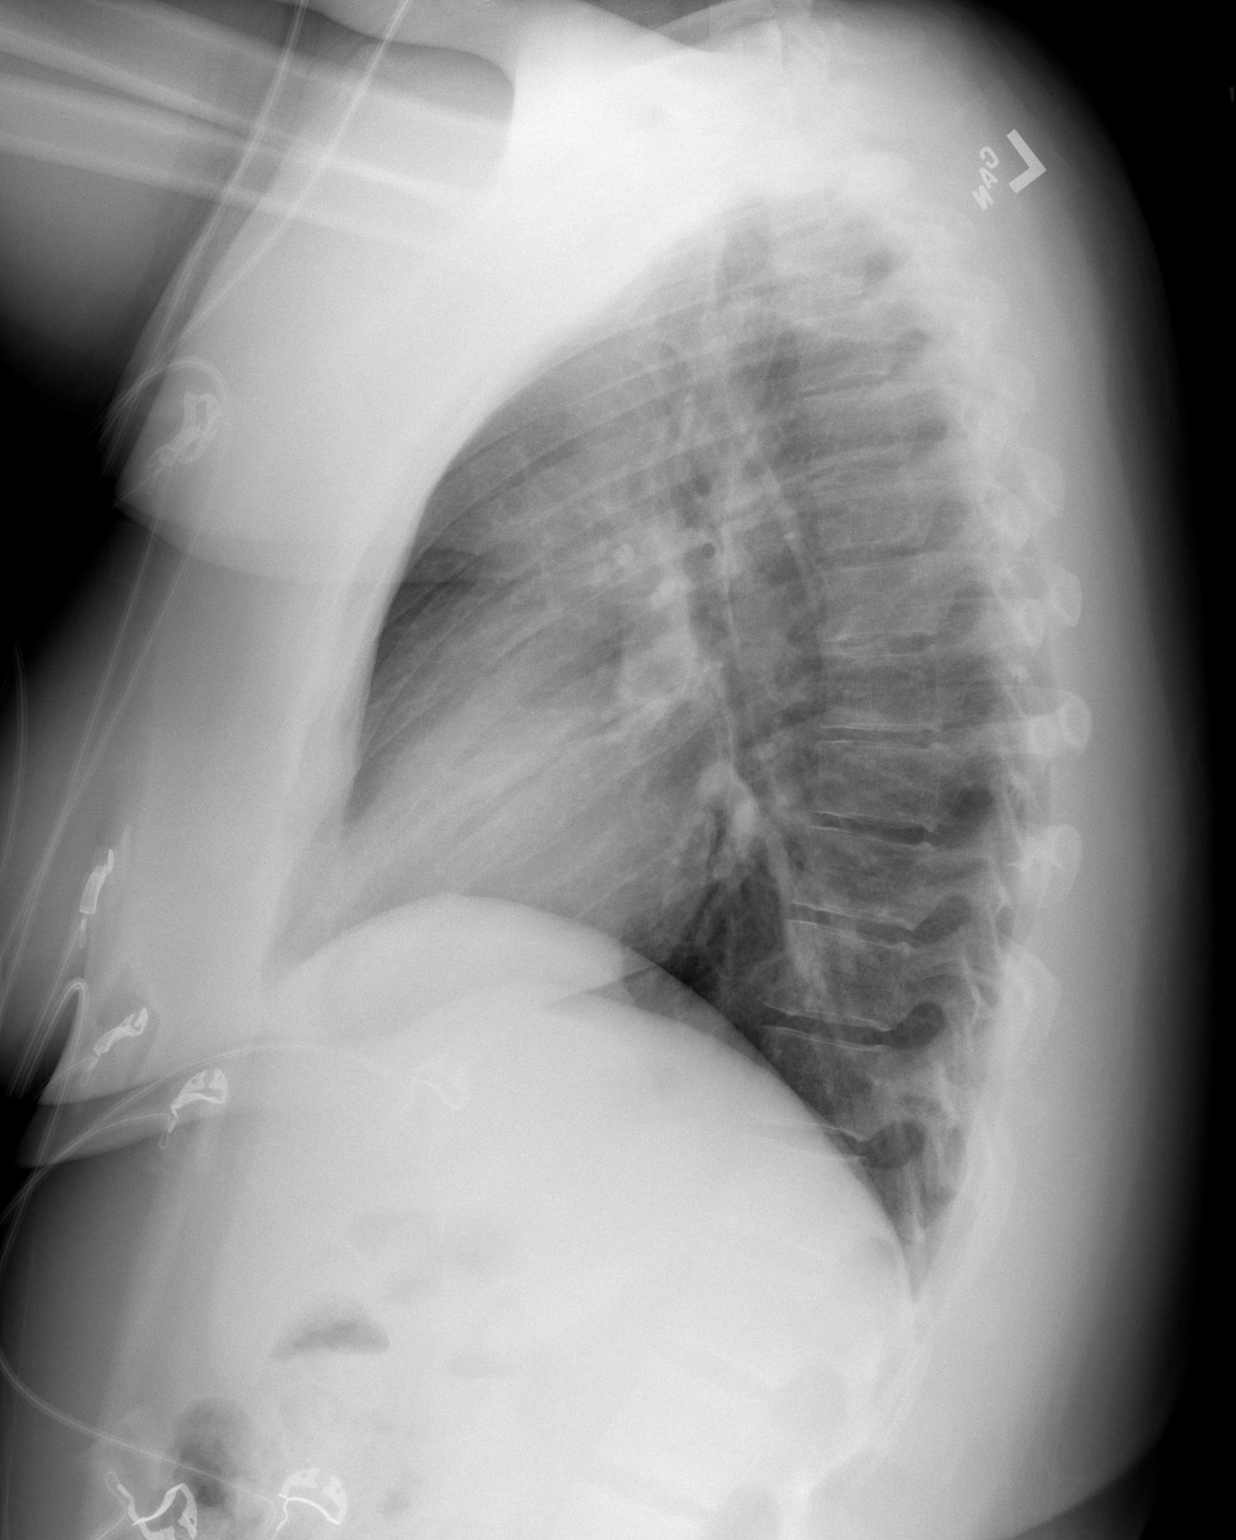

[2 of 2 positions shown; findings below may reference images not displayed]

FINDINGS: The lungs are well-aerated. Mild vascular congestion is noted. Mild
bibasilar atelectasis is seen. There is no evidence of pleural
effusion or pneumothorax.

The heart is normal in size; the mediastinal contour is within
normal limits. No acute osseous abnormalities are seen.
IMPRESSION: Mild vascular congestion noted.  Mild bibasilar atelectasis seen.

## 2016-12-09 ENCOUNTER — Emergency Department (HOSPITAL_BASED_OUTPATIENT_CLINIC_OR_DEPARTMENT_OTHER)
Admission: EM | Admit: 2016-12-09 | Discharge: 2016-12-09 | Disposition: A | Discharge status: Home / Self Care | Payer: Medicaid Other | Attending: Emergency Medicine | Admitting: Emergency Medicine

## 2016-12-09 ENCOUNTER — Encounter (HOSPITAL_BASED_OUTPATIENT_CLINIC_OR_DEPARTMENT_OTHER): Payer: Self-pay

## 2016-12-09 DIAGNOSIS — R0602 Shortness of breath: Secondary | ICD-10-CM | POA: Diagnosis present

## 2016-12-09 DIAGNOSIS — J45901 Unspecified asthma with (acute) exacerbation: Secondary | ICD-10-CM | POA: Diagnosis not present

## 2016-12-09 LAB — PREGNANCY, URINE: PREG TEST UR: NEGATIVE

## 2016-12-09 MED ORDER — IPRATROPIUM-ALBUTEROL 0.5-2.5 (3) MG/3ML IN SOLN
3.0000 mL | Freq: Once | RESPIRATORY_TRACT | Status: AC
  Administered 2016-12-09: 3 mL via RESPIRATORY_TRACT
  Filled 2016-12-09: qty 3

## 2016-12-09 MED ORDER — ALBUTEROL SULFATE HFA 108 (90 BASE) MCG/ACT IN AERS
1.0000 | INHALATION_SPRAY | Freq: Once | RESPIRATORY_TRACT | Status: AC
  Administered 2016-12-09: 1 via RESPIRATORY_TRACT
  Filled 2016-12-09: qty 6.7

## 2016-12-09 MED ORDER — PREDNISONE 20 MG PO TABS
40.0000 mg | ORAL_TABLET | Freq: Once | ORAL | Status: AC
  Administered 2016-12-09: 40 mg via ORAL
  Filled 2016-12-09: qty 2

## 2016-12-09 MED ORDER — ALBUTEROL SULFATE HFA 108 (90 BASE) MCG/ACT IN AERS: 1.0000 | INHALATION_SPRAY | Freq: Four times a day (QID) | RESPIRATORY_TRACT | 0 refills | Status: DC | PRN

## 2016-12-09 MED ORDER — PREDNISONE 10 MG PO TABS: 40.0000 mg | ORAL_TABLET | Freq: Every day | ORAL | 0 refills | Status: DC

## 2016-12-09 NOTE — ED Provider Notes (Signed)
[facility]   CSN: [id number] Arrival date & time: 12/09/16  1257     History   Chief Complaint Chief Complaint  Patient presents with  . Shortness of Breath    HPI Kerri Patrick is a 21 y.o. female.  The history is provided by the patient. No language interpreter was used.  Shortness of Breath    Kerri Patrick is a 21 y.o. female who presents to the Emergency Department complaining of sob.  She presents for evaluation of 6 days of increased shortness of breath and chest tightness similar to her prior asthma attacks. She has tried her inhaler at home with no significant change in her symptoms. No fevers, coughing, leg swelling or pain, vomiting, diarrhea. Past Medical History:  Diagnosis Date  . Asthma   . Pregnancy     Patient Active Problem List   Diagnosis Date Noted  . Mild persistent asthma without complication 08/15/2016  . Other allergic rhinitis 08/15/2016    Past Surgical History:  Procedure Laterality Date  . CESAREAN SECTION     x2    OB History    Gravida Para Term Preterm AB Living   1             SAB TAB Ectopic Multiple Live Births                   Home Medications    Prior to Admission medications   Medication Sig Start Date End Date Taking? Authorizing Provider  albuterol (PROVENTIL HFA;VENTOLIN HFA) 108 (90 Base) MCG/ACT inhaler Inhale 1-2 puffs into the lungs every 6 (six) hours as needed for wheezing or shortness of breath. 12/09/16   Tilden Fossa, MD  Albuterol Sulfate (PROVENTIL HFA IN) Inhale into the lungs.    Historical Provider, MD  predniSONE (DELTASONE) 10 MG tablet Take 4 tablets (40 mg total) by mouth daily. 12/09/16   Tilden Fossa, MD    Family History No family history on file.  Social History Social History  Substance Use Topics  . Smoking status: Never Smoker  . Smokeless tobacco: Never Used  . Alcohol use No     Allergies   Shellfish allergy   Review of Systems Review of  Systems  Respiratory: Positive for shortness of breath.   All other systems reviewed and are negative.    Physical Exam Updated Vital Signs BP 111/79 (BP Location: Left Arm)   Pulse 82   Temp 98.5 F (36.9 C) (Oral)   Resp 20   Ht  (1.575 m)   Wt 202 lb (91.6 kg)   LMP 11/10/2016   SpO2 100%   BMI 36.95 kg/m   Physical Exam  Constitutional: She is oriented to person, place, and time. She appears well-developed and well-nourished.  HENT:  Head: Normocephalic and atraumatic.  Cardiovascular: Normal rate and regular rhythm.   No murmur heard. Pulmonary/Chest: Effort normal and breath sounds normal. No respiratory distress.  Abdominal: Soft. There is no tenderness. There is no rebound and no guarding.  Musculoskeletal: She exhibits no edema or tenderness.  Neurological: She is alert and oriented to person, place, and time.  Skin: Skin is warm and dry.  Psychiatric: She has a normal mood and affect. Her behavior is normal.  Nursing note and vitals reviewed.    ED Treatments / Results  Labs (all labs ordered are listed, but only abnormal results are displayed) Labs Reviewed  PREGNANCY, URINE    EKG  EKG Interpretation None  Radiology No results found.  Procedures Procedures (including critical care time)  Medications Ordered in ED Medications  albuterol (PROVENTIL HFA;VENTOLIN HFA) 108 (90 Base) MCG/ACT inhaler 1 puff (not administered)  ipratropium-albuterol (DUONEB) 0.5-2.5 (3) MG/3ML nebulizer solution 3 mL (3 mLs Nebulization Given 12/09/16 1425)  predniSONE (DELTASONE) tablet 40 mg (40 mg Oral Given 12/09/16 1415)     Initial Impression / Assessment and Plan / ED Course  I have reviewed the triage vital signs and the nursing notes.  Pertinent labs & imaging results that were available during my care of the patient were reviewed by me and considered in my medical decision making (see chart for details).     Patient here for evaluation of  chest tightness for the last 6 days. No wheezing or respiratory distress and initial examination. On repeat assessment. An albuterol nebulizer treatment she endorses feeling improved. Repeat lung exam with clear lung sounds bilaterally with no respiratory distress. Current clinical picture is not consistent with PE, pneumonia, CHF, PTX. Discussed with patient home care for mild asthma with small steroid burst, albuterol treatments with close outpatient follow-up and return precautions.  Final Clinical Impressions(s) / ED Diagnoses   Final diagnoses:  Mild asthma with exacerbation, unspecified whether persistent    New Prescriptions New Prescriptions   ALBUTEROL (PROVENTIL HFA;VENTOLIN HFA) 108 (90 BASE) MCG/ACT INHALER    Inhale 1-2 puffs into the lungs every 6 (six) hours as needed for wheezing or shortness of breath.   PREDNISONE (DELTASONE) 10 MG TABLET    Take 4 tablets (40 mg total) by mouth daily.     Tilden Fossa, MD 12/09/16 1447

## 2016-12-09 NOTE — ED Triage Notes (Signed)
RT to triage for assessment 

## 2016-12-09 NOTE — ED Triage Notes (Signed)
c/o SOB x 6 days-increase use in neb use-NAD-steady gait

## 2017-03-31 ENCOUNTER — Emergency Department (HOSPITAL_BASED_OUTPATIENT_CLINIC_OR_DEPARTMENT_OTHER)
Admission: EM | Admit: 2017-03-31 | Discharge: 2017-04-01 | Disposition: A | Discharge status: Home / Self Care | Payer: Medicaid Other | Attending: Emergency Medicine | Admitting: Emergency Medicine

## 2017-03-31 ENCOUNTER — Encounter (HOSPITAL_BASED_OUTPATIENT_CLINIC_OR_DEPARTMENT_OTHER): Payer: Self-pay | Admitting: *Deleted

## 2017-03-31 DIAGNOSIS — R0602 Shortness of breath: Secondary | ICD-10-CM | POA: Diagnosis present

## 2017-03-31 DIAGNOSIS — J3089 Other allergic rhinitis: Secondary | ICD-10-CM | POA: Insufficient documentation

## 2017-03-31 DIAGNOSIS — R0600 Dyspnea, unspecified: Secondary | ICD-10-CM | POA: Insufficient documentation

## 2017-03-31 MED ORDER — ALBUTEROL SULFATE HFA 108 (90 BASE) MCG/ACT IN AERS
2.0000 | INHALATION_SPRAY | Freq: Once | RESPIRATORY_TRACT | Status: AC
  Administered 2017-03-31: 2 via RESPIRATORY_TRACT
  Filled 2017-03-31: qty 6.7

## 2017-03-31 NOTE — ED Notes (Signed)
Patient BS WNL, in no distress. No wheezes, rales or rhonchi noted withoxygen saturations of 100%. Patient complains of nasal congestion and throat feeling "funny". Administered 2 puffs albuterol MDI via aerochamber. Patient tolerated well.

## 2017-03-31 NOTE — ED Triage Notes (Signed)
Pt c/o SOB x 1 week , out of albuterol inhaler

## 2017-04-01 MED ORDER — FLUTICASONE PROPIONATE 50 MCG/ACT NA SUSP: 2.0000 | Freq: Every day | NASAL | 0 refills | Status: DC

## 2017-04-01 NOTE — ED Provider Notes (Signed)
[facility]   CSN: [id number] Arrival date & time: 03/31/17  2223     History   Chief Complaint Chief Complaint  Patient presents with  . Shortness of Breath    HPI Ayelen Sciortino is a 21 y.o. female.  Patient is a 21 year old female with history of asthma presenting with complaints of shortness of breath. This is been occurring intermittently over the past 2 weeks. She states that she occasionally develops irritation in her throat feels as if she cannot breathe. This happens sometimes at night and she reports nasal congestion.   The history is provided by the patient.  Shortness of Breath  This is a new problem. The average episode lasts 2 weeks. The problem occurs intermittently.The problem has been gradually worsening. Pertinent negatives include no fever, no cough, no sputum production, no chest pain, no leg pain and no leg swelling. She has tried nothing for the symptoms.    Past Medical History:  Diagnosis Date  . Asthma   . Pregnancy     Patient Active Problem List   Diagnosis Date Noted  . Mild persistent asthma without complication 08/15/2016  . Other allergic rhinitis 08/15/2016    Past Surgical History:  Procedure Laterality Date  . CESAREAN SECTION     x2    OB History    Gravida Para Term Preterm AB Living   1             SAB TAB Ectopic Multiple Live Births                   Home Medications    Prior to Admission medications   Medication Sig Start Date End Date Taking? Authorizing Provider  albuterol (PROVENTIL HFA;VENTOLIN HFA) 108 (90 Base) MCG/ACT inhaler Inhale 1-2 puffs into the lungs every 6 (six) hours as needed for wheezing or shortness of breath. 12/09/16   Tilden Fossa, MD  Albuterol Sulfate (PROVENTIL HFA IN) Inhale into the lungs.    [provider]    Family History History reviewed. No pertinent family history.  Social History Social History  Substance Use Topics  . Smoking status:  Never Smoker  . Smokeless tobacco: Never Used  . Alcohol use No     Allergies   Shellfish allergy   Review of Systems Review of Systems  Constitutional: Negative for fever.  Respiratory: Positive for shortness of breath. Negative for cough and sputum production.   Cardiovascular: Negative for chest pain and leg swelling.  All other systems reviewed and are negative.    Physical Exam Updated Vital Signs BP 105/73 (BP Location: Right Arm)   Pulse 78   Temp 98.8 F (37.1 C) (Oral)   Resp 20   Ht 5\' 2"  (1.575 m)   Wt 96.6 kg (213 lb)   LMP 03/23/2017   SpO2 100%   BMI 38.96 kg/m   Physical Exam  Constitutional: She is oriented to person, place, and time. She appears well-developed and well-nourished. No distress.  HENT:  Head: Normocephalic and atraumatic.  Mouth/Throat: Oropharynx is clear and moist.  Neck: Normal range of motion. Neck supple.  Cardiovascular: Normal rate and regular rhythm.  Exam reveals no gallop and no friction rub.   No murmur heard. Pulmonary/Chest: Effort normal and breath sounds normal. No stridor. No respiratory distress. She has no wheezes. She has no rales.  Abdominal: Soft. Bowel sounds are normal. She exhibits no distension. There is no tenderness.  Musculoskeletal: Normal range of motion.  Lymphadenopathy:    She has no cervical adenopathy.  Neurological: She is alert and oriented to person, place, and time.  Skin: Skin is warm and dry. She is not diaphoretic.  Nursing note and vitals reviewed.    ED Treatments / Results  Labs (all labs ordered are listed, but only abnormal results are displayed) Labs Reviewed - No data to display  EKG  EKG Interpretation None       Radiology No results found.  Procedures Procedures (including critical care time)  Medications Ordered in ED Medications  albuterol (PROVENTIL HFA;VENTOLIN HFA) 108 (90 Base) MCG/ACT inhaler 2 puff (2 puffs Inhalation Given 03/31/17 2235)     Initial  Impression / Assessment and Plan / ED Course  I have reviewed the triage vital signs and the nursing notes.  Pertinent labs & imaging results that were available during my care of the patient were reviewed by me and considered in my medical decision making (see chart for details).  Patient with episodic throat irritation, nasal congestion, difficulty breathing. Her lungs are clear, physical examination is normal, and oxygen saturations are 100%.  Her symptoms sound like there is an allergic component. She was given an albuterol MDI and will also be given Flonase. I will see how she responds. She is to follow-up with her primary Dr. if not improving.  Final Clinical Impressions(s) / ED Diagnoses   Final diagnoses:  None    New Prescriptions New Prescriptions   No medications on file     Geoffery Lyonselo, Calandria Mullings, MD 04/01/17 0028

## 2017-04-01 NOTE — Discharge Instructions (Signed)
Flonase as prescribed.  Albuterol MDI: Use 2 puffs every 4 hours as needed for difficulty breathing.  Follow up with your primary Dr. if not improving in the next 3-4 days, and return to the ER if your symptoms significantly worsen or change.

## 2017-05-08 ENCOUNTER — Ambulatory Visit: Payer: Medicaid Other | Admitting: Pediatrics

## 2017-05-24 ENCOUNTER — Ambulatory Visit (INDEPENDENT_AMBULATORY_CARE_PROVIDER_SITE_OTHER): Payer: Medicaid Other | Admitting: Allergy and Immunology

## 2017-05-24 ENCOUNTER — Encounter: Payer: Self-pay | Admitting: Allergy and Immunology

## 2017-05-24 VITALS — BP 102/70 | HR 80 | Temp 97.9°F | Resp 16 | Ht 61.5 in | Wt 219.0 lb

## 2017-05-24 DIAGNOSIS — J45901 Unspecified asthma with (acute) exacerbation: Secondary | ICD-10-CM | POA: Diagnosis not present

## 2017-05-24 DIAGNOSIS — R0600 Dyspnea, unspecified: Secondary | ICD-10-CM | POA: Insufficient documentation

## 2017-05-24 DIAGNOSIS — R0609 Other forms of dyspnea: Secondary | ICD-10-CM | POA: Diagnosis not present

## 2017-05-24 DIAGNOSIS — M940 Chondrocostal junction syndrome [Tietze]: Secondary | ICD-10-CM | POA: Insufficient documentation

## 2017-05-24 MED ORDER — PREDNISONE 1 MG PO TABS: 10.0000 mg | ORAL_TABLET | Freq: Every day | ORAL | Status: AC

## 2017-05-24 MED ORDER — FLUTICASONE PROPIONATE HFA 110 MCG/ACT IN AERO: 2.0000 | INHALATION_SPRAY | Freq: Two times a day (BID) | RESPIRATORY_TRACT | 12 refills | Status: AC

## 2017-05-24 MED ORDER — IPRATROPIUM-ALBUTEROL 0.5-2.5 (3) MG/3ML IN SOLN: RESPIRATORY_TRACT | 1 refills | Status: AC

## 2017-05-24 NOTE — Progress Notes (Signed)
Follow-up Note  RE: Kerri Patrick MRN: [id number] DOB: Jan 24, 1996 Date of Office Visit: 05/24/2017  Primary care provider: Patient, No Pcp Patrick Referring provider: No ref. provider found  History of present illness: Kerri Patrick is a 21 y.o. female with persistent asthma, allergic rhinitis, and food allergy presenting today for sick visit.  She was last seen in this clinic in December 2017.  She reports that over the past 2 days she has been experiencing dyspnea and increased albuterol requirement.  She reports that she is having trouble getting a deep satisfying breath.  Occasionally after several attempts, she will attain a satisfying yawn which provides temporary relief.  She also reports a sharp pain at her sternum.  The pain is reproducible with pressure.  She has not had extended travel or lower extremity pain, heat, or edema.  She has no nasal or sinus symptom complaints today.   Assessment and plan: Asthma with acute exacerbation  Prednisone has been provided, 20 mg x 4 days, 10 mg x1 day, then stop.  A prescription has been provided for Flovent (fluticasone) 110 g,  2 inhalations twice a day. To maximize pulmonary deposition, a spacer has been provided along with instructions for its proper administration with an HFA inhaler.  Continue albuterol, once 2 inhalations every 4-6 hours as needed.  Patrick patient's request, 8 Patrick prescription and has been provided for DuoNeb every 4-6 hours if needed via nebulizer.  The patient has been asked to contact me if her symptoms persist or progress. Otherwise, she may return for follow up in 4 months.  Dyspnea Dyspnea. The patient's sensation of air-hunger, not being able to get a full breath on inspiration, which is relieved by a yawn suggests sighing dyspnea. Other less likely etiologies include vocal cord dysfunction and pulmonary embolism.  She has not been traveling and has had no lower extremity swelling or pain.   Diaphragmatic  breathing, or belly breathing, has been discussed with the patient as this technique often times relieves sighing dyspnea.  For now, continue to have access to albuterol HFA, if needed.  If this problem persists or progresses, she is to seek medical attention immediately.  Costochondritis, acute The history and physical examination suggests costochondritis.  Prednisone has been provided (as above).  Have also recommended taking NSAIDs as needed.  If this problem persists or progresses, she is to seek medical attention immediately.   Meds ordered this encounter  Medications  . fluticasone (FLOVENT HFA) 110 MCG/ACT inhaler    Sig: Inhale 2 puffs into the lungs 2 (two) times daily.    Dispense:  1 Inhaler    Refill:  12  . ipratropium-albuterol (DUONEB) 0.5-2.5 (3) MG/3ML SOLN    Sig: 1 vial every 4-6 hours as needed.    Dispense:  360 mL    Refill:  1  . predniSONE (DELTASONE) tablet 10 mg    Diagnostics: Spirometry reveals an FVC of 3.75 L and an FEV1 of 3.23 L with 250 mL post bronchodilator improvement.  Please see scanned spirometry results for details.    Physical examination: Blood pressure 102/70, pulse 80, temperature 97.9 F (36.6 C), temperature source Oral, resp. rate 16, height 5' 1.5" (1.562 m), weight 219 lb (99.3 kg), SpO2 97 %.  General: Alert, interactive, in no acute distress. HEENT: TMs pearly gray, turbinates mildly edematous without discharge, post-pharynx unremarkable. Neck: Supple without lymphadenopathy. Lungs: Clear to auscultation without wheezing, rhonchi or rales. CV: Normal S1, S2 without murmurs. Skin: Warm and  dry, without lesions or rashes. Musculoskeletal: Reproducible sternal chest pain with palpation.  The following portions of the patient's history were reviewed and updated as appropriate: allergies, current medications, past family history, past medical history, past social history, past surgical history and problem list.  Allergies as  of 05/24/2017      Reactions   Shellfish Allergy       Medication List       Accurate as of 05/24/17  1:49 PM. Always use your most recent med list.          albuterol 108 (90 Base) MCG/ACT inhaler Commonly known as:  PROVENTIL HFA;VENTOLIN HFA Inhale 1-2 puffs into the lungs every 6 (six) hours as needed for wheezing or shortness of breath.   fluticasone 110 MCG/ACT inhaler Commonly known as:  FLOVENT HFA Inhale 2 puffs into the lungs 2 (two) times daily.   ipratropium-albuterol 0.5-2.5 (3) MG/3ML Soln Commonly known as:  DUONEB 1 vial every 4-6 hours as needed.            Discharge Care Instructions        Start     Ordered   05/25/17 0800  PREDNISONE 1 MG PO TABS  Daily with breakfast     05/24/17 1348   05/24/17 0000  Spirometry with Graph    Question Answer Comment  Where should this test be performed? Other   Basic spirometry No   Spirometry pre & post bronchodilator Yes      05/24/17 1348   05/24/17 0000  fluticasone (FLOVENT HFA) 110 MCG/ACT inhaler  2 times daily     05/24/17 1348   05/24/17 0000  ipratropium-albuterol (DUONEB) 0.5-2.5 (3) MG/3ML SOLN     05/24/17 1348      Allergies  Allergen Reactions  . Shellfish Allergy    Review of systems: Review of systems negative except as noted in HPI / PMHx or noted below: Constitutional: Negative.  HENT: Negative.   Eyes: Negative.  Respiratory: Negative.   Cardiovascular: Negative.  Gastrointestinal: Negative.  Genitourinary: Negative.  Musculoskeletal: Negative.  Neurological: Negative.  Endo/Heme/Allergies: Negative.  Cutaneous: Negative.  Past Medical History:  Diagnosis Date  . Asthma   . Pregnancy     History reviewed. No pertinent family history.  Social History   Social History  . Marital status: Married    Spouse name: N/A  . Number of children: N/A  . Years of education: N/A   Occupational History  . Not on file.   Social History Main Topics  . Smoking status: Never  Smoker  . Smokeless tobacco: Never Used  . Alcohol use No  . Drug use: No  . Sexual activity: Yes    Birth control/ protection: None   Other Topics Concern  . Not on file   Social History Narrative  . No narrative on file    I appreciate the opportunity to take part in Kerri Patrick's care. Please do not hesitate to contact me with questions.  Sincerely,   R. Jorene Guest, MD

## 2017-05-24 NOTE — Assessment & Plan Note (Signed)
Dyspnea. The patient's sensation of air-hunger, not being able to get a full breath on inspiration, which is relieved by a yawn suggests sighing dyspnea. Other less likely etiologies include vocal cord dysfunction and pulmonary embolism.  She has not been traveling and has had no lower extremity swelling or pain.   Diaphragmatic breathing, or belly breathing, has been discussed with the patient as this technique often times relieves sighing dyspnea.  For now, continue to have access to albuterol HFA, if needed.  If this problem persists or progresses, she is to seek medical attention immediately.

## 2017-05-24 NOTE — Patient Instructions (Signed)
Asthma with acute exacerbation  Prednisone has been provided, 20 mg x 4 days, 10 mg x1 day, then stop.  A prescription has been provided for Flovent (fluticasone) 110 g,  2 inhalations twice a day. To maximize pulmonary deposition, a spacer has been provided along with instructions for its proper administration with an HFA inhaler.  Continue albuterol, once 2 inhalations every 4-6 hours as needed.  Per patient's request, 8 per prescription and has been provided for DuoNeb every 4-6 hours if needed via nebulizer.  The patient has been asked to contact me if her symptoms persist or progress. Otherwise, she may return for follow up in 4 months.  Dyspnea Dyspnea. The patient's sensation of air-hunger, not being able to get a full breath on inspiration, which is relieved by a yawn suggests sighing dyspnea. Other less likely etiologies include vocal cord dysfunction and pulmonary embolism.  She has not been traveling and has had no lower extremity swelling or pain.   Diaphragmatic breathing, or belly breathing, has been discussed with the patient as this technique often times relieves sighing dyspnea.  For now, continue to have access to albuterol HFA, if needed.  If this problem persists or progresses, she is to seek medical attention immediately.  Costochondritis, acute The history and physical examination suggests costochondritis.  Prednisone has been provided (as above).  Have also recommended taking NSAIDs as needed.  If this problem persists or progresses, she is to seek medical attention immediately.   Return in about 4 months (around 09/23/2017), or if symptoms worsen or fail to improve.

## 2017-05-24 NOTE — Assessment & Plan Note (Signed)
The history and physical examination suggests costochondritis.  Prednisone has been provided (as above).  Have also recommended taking NSAIDs as needed.  If this problem persists or progresses, she is to seek medical attention immediately.

## 2017-05-24 NOTE — Assessment & Plan Note (Signed)
   Prednisone has been provided, 20 mg x 4 days, 10 mg x1 day, then stop.  A prescription has been provided for Flovent (fluticasone) 110 g, 2 inhalations twice a day. To maximize pulmonary deposition, a spacer has been provided along with instructions for its proper administration with an HFA inhaler.  Continue albuterol, once 2 inhalations every 4-6 hours as needed.  Per patient's request, 8 per prescription and has been provided for DuoNeb every 4-6 hours if needed via nebulizer.  The patient has been asked to contact me if her symptoms persist or progress. Otherwise, she may return for follow up in 4 months.

## 2017-06-20 ENCOUNTER — Ambulatory Visit: Payer: Self-pay | Admitting: Pediatrics

## 2017-08-28 ENCOUNTER — Emergency Department (HOSPITAL_BASED_OUTPATIENT_CLINIC_OR_DEPARTMENT_OTHER)
Admission: EM | Admit: 2017-08-28 | Discharge: 2017-08-28 | Disposition: A | Discharge status: Home / Self Care | Payer: Medicaid Other | Attending: Emergency Medicine | Admitting: Emergency Medicine

## 2017-08-28 ENCOUNTER — Other Ambulatory Visit: Payer: Self-pay

## 2017-08-28 ENCOUNTER — Encounter (HOSPITAL_BASED_OUTPATIENT_CLINIC_OR_DEPARTMENT_OTHER): Payer: Self-pay

## 2017-08-28 DIAGNOSIS — Z5321 Procedure and treatment not carried out due to patient leaving prior to being seen by health care provider: Secondary | ICD-10-CM | POA: Insufficient documentation

## 2017-08-28 DIAGNOSIS — R42 Dizziness and giddiness: Secondary | ICD-10-CM | POA: Diagnosis not present

## 2017-08-28 LAB — URINALYSIS, ROUTINE W REFLEX MICROSCOPIC
BILIRUBIN URINE: NEGATIVE
GLUCOSE, UA: NEGATIVE mg/dL
HGB URINE DIPSTICK: NEGATIVE
Ketones, ur: NEGATIVE mg/dL
Leukocytes, UA: NEGATIVE
Nitrite: NEGATIVE
PROTEIN: NEGATIVE mg/dL
Specific Gravity, Urine: 1.02 (ref 1.005–1.030)
pH: 6 (ref 5.0–8.0)

## 2017-08-28 LAB — PREGNANCY, URINE: Preg Test, Ur: NEGATIVE

## 2017-08-28 NOTE — ED Notes (Signed)
No answer, pt not in lobby  °

## 2017-08-28 NOTE — ED Notes (Signed)
No answer when called for room 

## 2017-08-28 NOTE — ED Triage Notes (Signed)
Pt c/o lightheadedness and dizziness after taking phentermine with another diet pill

## 2018-01-29 ENCOUNTER — Other Ambulatory Visit: Payer: Self-pay

## 2018-01-29 ENCOUNTER — Emergency Department (HOSPITAL_BASED_OUTPATIENT_CLINIC_OR_DEPARTMENT_OTHER)
Admission: EM | Admit: 2018-01-29 | Discharge: 2018-01-29 | Disposition: A | Discharge status: Home / Self Care | Payer: Medicaid Other | Attending: Emergency Medicine | Admitting: Emergency Medicine

## 2018-01-29 ENCOUNTER — Encounter (HOSPITAL_BASED_OUTPATIENT_CLINIC_OR_DEPARTMENT_OTHER): Payer: Self-pay | Admitting: Emergency Medicine

## 2018-01-29 DIAGNOSIS — K1370 Unspecified lesions of oral mucosa: Secondary | ICD-10-CM | POA: Insufficient documentation

## 2018-01-29 DIAGNOSIS — Z5321 Procedure and treatment not carried out due to patient leaving prior to being seen by health care provider: Secondary | ICD-10-CM | POA: Insufficient documentation

## 2018-01-29 DIAGNOSIS — N926 Irregular menstruation, unspecified: Secondary | ICD-10-CM | POA: Insufficient documentation

## 2018-01-29 NOTE — ED Triage Notes (Addendum)
Pt states she has bumps on tongue and feels like her throat is swollen.  No fever.  No sore throat.  Pt also states she has been on her period for a month non-stop.

## 2018-01-31 NOTE — ED Notes (Signed)
Follow up call complete  Pt states has appoint to see pcp on Friday     1415   01/31/18  s Wilmer Berryhill rn

## 2019-07-18 ENCOUNTER — Encounter (HOSPITAL_BASED_OUTPATIENT_CLINIC_OR_DEPARTMENT_OTHER): Payer: Self-pay

## 2019-07-18 ENCOUNTER — Emergency Department (HOSPITAL_BASED_OUTPATIENT_CLINIC_OR_DEPARTMENT_OTHER)
Admission: EM | Admit: 2019-07-18 | Discharge: 2019-07-18 | Disposition: A | Discharge status: Home / Self Care | Payer: Self-pay | Attending: Emergency Medicine | Admitting: Emergency Medicine

## 2019-07-18 ENCOUNTER — Other Ambulatory Visit: Payer: Self-pay

## 2019-07-18 DIAGNOSIS — R110 Nausea: Secondary | ICD-10-CM | POA: Insufficient documentation

## 2019-07-18 DIAGNOSIS — R0602 Shortness of breath: Secondary | ICD-10-CM | POA: Insufficient documentation

## 2019-07-18 DIAGNOSIS — Z5321 Procedure and treatment not carried out due to patient leaving prior to being seen by health care provider: Secondary | ICD-10-CM | POA: Insufficient documentation

## 2019-07-18 NOTE — ED Triage Notes (Signed)
Pt c/o cough x 2 days--SOB, nausea x today-NAD-steady gait

## 2019-07-18 NOTE — ED Notes (Signed)
Unable to locate patient in waiting room or outside.

## 2019-07-18 NOTE — ED Notes (Signed)
Pt has not returned to lobby. LWBS after triage.

## 2020-01-28 ENCOUNTER — Emergency Department (HOSPITAL_BASED_OUTPATIENT_CLINIC_OR_DEPARTMENT_OTHER)
Admission: EM | Admit: 2020-01-28 | Discharge: 2020-01-28 | Disposition: A | Discharge status: Home / Self Care | Payer: Self-pay | Attending: Emergency Medicine | Admitting: Emergency Medicine

## 2020-01-28 ENCOUNTER — Encounter (HOSPITAL_BASED_OUTPATIENT_CLINIC_OR_DEPARTMENT_OTHER): Payer: Self-pay | Admitting: *Deleted

## 2020-01-28 ENCOUNTER — Other Ambulatory Visit: Payer: Self-pay

## 2020-01-28 DIAGNOSIS — Z79899 Other long term (current) drug therapy: Secondary | ICD-10-CM | POA: Insufficient documentation

## 2020-01-28 DIAGNOSIS — L249 Irritant contact dermatitis, unspecified cause: Secondary | ICD-10-CM | POA: Insufficient documentation

## 2020-01-28 DIAGNOSIS — J45909 Unspecified asthma, uncomplicated: Secondary | ICD-10-CM | POA: Insufficient documentation

## 2020-01-28 MED ORDER — PREDNISONE 10 MG (21) PO TBPK: ORAL_TABLET | Freq: Every day | ORAL | 0 refills | Status: DC

## 2020-01-28 NOTE — Discharge Instructions (Signed)
Take the steroids to help with your rash. You can take Benadryl or a daily antihistamine such as Zyrtec or Allegra to help with the itching. Return to the ER if you start to experience worsening rash, tongue swelling, lip swelling, fever.

## 2020-01-28 NOTE — ED Triage Notes (Signed)
Rash on her forearms and abdomen after working in the yard. Rash looks like poison ivy.

## 2020-01-28 NOTE — ED Provider Notes (Signed)
[facility] EMERGENCY DEPARTMENT Provider Note   CSN: [id number] Arrival date & time: 01/28/20  1956     History Chief Complaint  Patient presents with  . Rash    Kerri Patrick is a 23 y.o. female with a past medical history of asthma presenting to the ED with a chief complaint of a rash.  3 to 4 days ago started experiencing an itchy rash to bilateral arms.  States that this has spread to her abdomen.  She was concerned because last week she was working in the yard with her father was concerned that she had a reaction to poison ivy.  She has been using over-the-counter poison ivy cream and hydrocortisone with some improvement in her symptoms but continues to have the rash.  She denies any tongue or lip swelling, facial swelling, trouble breathing, trouble swallowing, fever, neck stiffness or tick bites. No sick contacts with similar symptoms.  HPI     Past Medical History:  Diagnosis Date  . Asthma     Patient Active Problem List   Diagnosis Date Noted  . Asthma with acute exacerbation 05/24/2017  . Dyspnea 05/24/2017  . Costochondritis, acute 05/24/2017  . Mild persistent asthma without complication 08/15/2016  . Other allergic rhinitis 08/15/2016    Past Surgical History:  Procedure Laterality Date  . CESAREAN SECTION     x2     OB History    Gravida  1   Para      Term      Preterm      AB      Living        SAB      TAB      Ectopic      Multiple      Live Births              No family history on file.  Social History   Tobacco Use  . Smoking status: Never Smoker  . Smokeless tobacco: Never Used  Substance Use Topics  . Alcohol use: No  . Drug use: No    Home Medications Prior to Admission medications   Medication Sig Start Date End Date Taking? Authorizing Provider  albuterol (PROVENTIL HFA;VENTOLIN HFA) 108 (90 Base) MCG/ACT inhaler Inhale 1-2 puffs into the lungs every 6 (six) hours as needed for wheezing or  shortness of breath. 12/09/16  Yes Tilden Fossa, MD  fluticasone (FLOVENT HFA) 110 MCG/ACT inhaler Inhale 2 puffs into the lungs 2 (two) times daily. 05/24/17   Bobbitt, Heywood Iles, MD  ipratropium-albuterol (DUONEB) 0.5-2.5 (3) MG/3ML SOLN 1 vial every 4-6 hours as needed. 05/24/17   Bobbitt, Heywood Iles, MD  predniSONE (STERAPRED UNI-PAK 21 TAB) 10 MG (21) TBPK tablet Take by mouth daily. Take 6 tabs by mouth daily  for 2 days, then 5 tabs for 2 days, then 4 tabs for 2 days, then 3 tabs for 2 days, 2 tabs for 2 days, then 1 tab by mouth daily for 2 days 01/28/20   Dietrich Pates, PA-C    Allergies    Shellfish allergy  Review of Systems   Review of Systems  Constitutional: Negative for chills and fever.  Skin: Positive for rash.  Neurological: Negative for weakness and numbness.    Physical Exam Updated Vital Signs BP (!) 127/95   Pulse 72   Temp 98.7 F (37.1 C) (Oral)   Resp 18   Ht 5\' 2"  (1.575 m)   Wt 111.1 kg   LMP  01/20/2020   SpO2 99%   BMI 44.81 kg/m   Physical Exam Vitals and nursing note reviewed.  Constitutional:      General: She is not in acute distress.    Appearance: She is well-developed. She is not diaphoretic.     Comments: No signs of angioedema or anaphylaxis.  HENT:     Head: Normocephalic and atraumatic.  Eyes:     General: No scleral icterus.    Conjunctiva/sclera: Conjunctivae normal.  Pulmonary:     Effort: Pulmonary effort is normal. No respiratory distress.  Musculoskeletal:     Cervical back: Normal range of motion.  Skin:    Findings: Rash present.     Comments: Erythematous, papular rash noted to bilateral forearms, abdomen. No vesicles, pustules or open sores noted.  Neurological:     Mental Status: She is alert.     ED Results / Procedures / Treatments   Labs (all labs ordered are listed, but only abnormal results are displayed) Labs Reviewed - No data to display  EKG None  Radiology No results  found.  Procedures Procedures (including critical care time)  Medications Ordered in ED Medications - No data to display  ED Course  I have reviewed the triage vital signs and the nursing notes.  Pertinent labs & imaging results that were available during my care of the patient were reviewed by me and considered in my medical decision making (see chart for details).    MDM Rules/Calculators/A&P                      Pt has a patent airway without stridor and is handling secretions without difficulty; no angioedema. No blisters, no pustules, no warmth, no draining sinus tracts, no superficial abscesses, no bullous impetigo, no vesicles, no desquamation, no target lesions with dusky purpura or a central bulla. Not tender to touch. No concern for superimposed infection. No concern for SJS, TEN, TSS, tick borne illness, syphilis or other life-threatening condition.  Will treat with steroids for possible contact dermatitis.  We will have her continue antihistamines as needed, follow-up with PCP and dermatology and return for worsening symptoms.   Patient is hemodynamically stable, in NAD, and able to ambulate in the ED. Evaluation does not show pathology that would require ongoing emergent intervention or inpatient treatment. I explained the diagnosis to the patient. Pain has been managed and has no complaints prior to discharge. Patient is comfortable with above plan and is stable for discharge at this time. All questions were answered prior to disposition. Strict return precautions for returning to the ED were discussed. Encouraged follow up with PCP.   An After Visit Summary was printed and given to the patient.   Portions of this note were generated with Lobbyist. Dictation errors may occur despite best attempts at proofreading.  Final Clinical Impression(s) / ED Diagnoses Final diagnoses:  Irritant contact dermatitis, unspecified trigger    Rx / DC Orders ED  Discharge Orders         Ordered    predniSONE (STERAPRED UNI-PAK 21 TAB) 10 MG (21) TBPK tablet  Daily     01/28/20 2032           Delia Heady, PA-C 01/28/20 2036    Lucrezia Starch, MD 01/29/20 1306

## 2020-05-22 ENCOUNTER — Encounter (HOSPITAL_BASED_OUTPATIENT_CLINIC_OR_DEPARTMENT_OTHER): Payer: Self-pay | Admitting: Emergency Medicine

## 2020-05-22 ENCOUNTER — Other Ambulatory Visit: Payer: Self-pay

## 2020-05-22 ENCOUNTER — Emergency Department (HOSPITAL_BASED_OUTPATIENT_CLINIC_OR_DEPARTMENT_OTHER)
Admission: EM | Admit: 2020-05-22 | Discharge: 2020-05-22 | Disposition: A | Discharge status: Home / Self Care | Payer: Self-pay | Attending: Emergency Medicine | Admitting: Emergency Medicine

## 2020-05-22 DIAGNOSIS — L509 Urticaria, unspecified: Secondary | ICD-10-CM | POA: Insufficient documentation

## 2020-05-22 DIAGNOSIS — Z79899 Other long term (current) drug therapy: Secondary | ICD-10-CM | POA: Insufficient documentation

## 2020-05-22 DIAGNOSIS — Z7951 Long term (current) use of inhaled steroids: Secondary | ICD-10-CM | POA: Insufficient documentation

## 2020-05-22 DIAGNOSIS — J45909 Unspecified asthma, uncomplicated: Secondary | ICD-10-CM | POA: Insufficient documentation

## 2020-05-22 MED ORDER — DIPHENHYDRAMINE HCL 25 MG PO TABS: ORAL_TABLET | ORAL | 0 refills | Status: AC

## 2020-05-22 MED ORDER — DIPHENHYDRAMINE HCL 25 MG PO CAPS
25.0000 mg | ORAL_CAPSULE | Freq: Once | ORAL | Status: AC
  Administered 2020-05-22: 25 mg via ORAL
  Filled 2020-05-22: qty 1

## 2020-05-22 MED ORDER — FAMOTIDINE 20 MG PO TABS
20.0000 mg | ORAL_TABLET | Freq: Once | ORAL | Status: AC
  Administered 2020-05-22: 20 mg via ORAL
  Filled 2020-05-22: qty 1

## 2020-05-22 NOTE — ED Triage Notes (Signed)
Onset of hives/welts on abdomen, thighs after eating pizza. Airway intact. Lungs clear. VSS. No new medicines.

## 2020-05-22 NOTE — ED Provider Notes (Signed)
[facility] Provider Note: Kerri Dell, MD, FACEP  CSN: [id number] MRN: [id number] ARRIVAL: 05/22/20 at 0133 ROOM: [facility]   CHIEF COMPLAINT  Urticaria   HISTORY OF PRESENT ILLNESS  05/22/20 4:17 AM Kerri Patrick is a 24 y.o. female who ate spinach pizza yesterday evening about 9:30 PM.  Spinach pizza is not an unusual thing for her to eat.  About 12:30 AM she awakened with a pruritic, generalized urticarial rash.  She had no associated shortness of breath, throat swelling, nausea, vomiting or diarrhea.  She did have some left upper quadrant abdominal pain which she rated as a 3 out of 10 but states she gets this off and on frequently.  She was given Benadryl and Pepcid in triage with significant relief.  She states her hives are gone.   Past Medical History:  Diagnosis Date  . Asthma     Past Surgical History:  Procedure Laterality Date  . CESAREAN SECTION     x2    No family history on file.  Social History   Tobacco Use  . Smoking status: Never Smoker  . Smokeless tobacco: Never Used  Vaping Use  . Vaping Use: Never used  Substance Use Topics  . Alcohol use: No  . Drug use: No    Prior to Admission medications   Medication Sig Start Date End Date Taking? Authorizing Provider  albuterol (PROVENTIL HFA;VENTOLIN HFA) 108 (90 Base) MCG/ACT inhaler Inhale 1-2 puffs into the lungs every 6 (six) hours as needed for wheezing or shortness of breath. 12/09/16   Tilden Fossa, MD  diphenhydrAMINE (BENADRYL) 25 MG tablet Take 2 tablets (50 mg) every 6 hours as needed for hives. 05/22/20   Da Authement, MD  fluticasone (FLOVENT HFA) 110 MCG/ACT inhaler Inhale 2 puffs into the lungs 2 (two) times daily. 05/24/17   Bobbitt, Heywood Iles, MD  ipratropium-albuterol (DUONEB) 0.5-2.5 (3) MG/3ML SOLN 1 vial every 4-6 hours as needed. 05/24/17   Bobbitt, Heywood Iles, MD    Allergies Shellfish allergy   REVIEW OF SYSTEMS  Negative except as noted here or in the  History of Present Illness.   PHYSICAL EXAMINATION  Initial Vital Signs Blood pressure (!) 98/58, pulse 86, temperature 98.6 F (37 C), temperature source Oral, resp. rate 18, weight 110 kg, last menstrual period 04/30/2020, SpO2 100 %.  Examination General: Well-developed, well-nourished female in no acute distress; appearance consistent with age of record HENT: normocephalic; atraumatic; no pharyngeal edema Eyes: pupils equal, round and reactive to light; extraocular muscles intact Neck: supple Heart: regular rate and rhythm Lungs: clear to auscultation bilaterally Abdomen: soft; nondistended; nontender; bowel sounds present Extremities: No deformity; full range of motion; pulses normal Neurologic: Awake, alert and oriented; motor function intact in all extremities and symmetric; no facial droop Skin: Warm and dry; no rash Psychiatric: Normal mood and affect   RESULTS  Summary of this visit's results, reviewed and interpreted by myself:   EKG Interpretation  Date/Time:    Ventricular Rate:    PR Interval:    QRS Duration:   QT Interval:    QTC Calculation:   R Axis:     Text Interpretation:        Laboratory Studies: No results found for this or any previous visit (from the past 24 hour(s)). Imaging Studies: No results found.  ED COURSE and MDM  Nursing notes, initial and subsequent vitals signs, including pulse oximetry, reviewed and interpreted by myself.  Vitals:   05/22/20 6203 05/22/20 0205  05/22/20 0412  BP: 126/86  (!) 98/58  Pulse: 88  86  Resp: 18  18  Temp: 98.6 F (37 C)    TempSrc: Oral    SpO2: 99%  100%  Weight:  110 kg    Medications  diphenhydrAMINE (BENADRYL) capsule 25 mg (25 mg Oral Given 05/22/20 0213)  famotidine (PEPCID) tablet 20 mg (20 mg Oral Given 05/22/20 2119)   The cause of the patient's hives is unclear but she had significant relief with 25 mg of Benadryl and 20 mg of Pepcid.  She was advised to keep Benadryl with her should  symptoms recur.   PROCEDURES  Procedures   ED DIAGNOSES     ICD-10-CM   1. Urticaria  L50.9        Butler Vegh, MD 05/22/20 0425

## 2020-05-23 ENCOUNTER — Emergency Department (HOSPITAL_BASED_OUTPATIENT_CLINIC_OR_DEPARTMENT_OTHER)
Admission: EM | Admit: 2020-05-23 | Discharge: 2020-05-23 | Disposition: A | Discharge status: Home / Self Care | Payer: Self-pay | Attending: Emergency Medicine | Admitting: Emergency Medicine

## 2020-05-23 ENCOUNTER — Encounter (HOSPITAL_BASED_OUTPATIENT_CLINIC_OR_DEPARTMENT_OTHER): Payer: Self-pay

## 2020-05-23 ENCOUNTER — Other Ambulatory Visit: Payer: Self-pay

## 2020-05-23 DIAGNOSIS — J4531 Mild persistent asthma with (acute) exacerbation: Secondary | ICD-10-CM | POA: Insufficient documentation

## 2020-05-23 DIAGNOSIS — T7840XD Allergy, unspecified, subsequent encounter: Secondary | ICD-10-CM | POA: Insufficient documentation

## 2020-05-23 DIAGNOSIS — Z79899 Other long term (current) drug therapy: Secondary | ICD-10-CM | POA: Insufficient documentation

## 2020-05-23 DIAGNOSIS — R21 Rash and other nonspecific skin eruption: Secondary | ICD-10-CM | POA: Insufficient documentation

## 2020-05-23 NOTE — ED Notes (Signed)
ED Provider at bedside. 

## 2020-05-23 NOTE — Discharge Instructions (Signed)
Continue to take the Benadryl every 6 hours for 5 days.  Be careful as it can make you drowsy.  Take the steroid Dosepak.  This should help with the hives and swelling like we discussed.  Return to the emergency department if you have any swelling of your mouth or throat or any difficulty breathing.

## 2020-05-23 NOTE — ED Triage Notes (Signed)
Pt arrives with c/o hives, weakness to legs with ambulation, and some oral swelling. Pt reports getting a steroid shot recently for the same. Airway in tact, speaking in full sentences in triage, able to maintain secretions. NAD.

## 2020-05-23 NOTE — ED Provider Notes (Signed)
[facility] EMERGENCY DEPARTMENT Provider Note   CSN: [id number] Arrival date & time: 05/23/20  1055     History Chief Complaint  Patient presents with  . Allergic Reaction    Kerri Patrick is a 24 y.o. female with past medical history significant for asthma, shellfish allergy.  HPI Patient presents to emergency department today with chief complaint of allergic reaction.  Patient was seen in the ED in the early morning hours yesterday.  She broke out into hives at home.  She received Benadryl in triage and by the time of provider evaluation hives had resolved.  She was discharged home with recommendation of as needed Benadryl.  Patient states the next day when she woke up she again had hives on her body and was itching.  She went to urgent care.  She was given Kenalog IM and discharged with a prescription for a Medrol Dosepak. Patient states this morning when she woke up she again had a rash on her bilateral arms.  Not as impressive as first day of hives.  The hives itched her throat was getting tight and that both her legs were weak.  She did not take any medications for symptoms prior to arrival.  Has not had Benadryl in x24 hours.  Has not yet started the Medrol Dosepak.  Denies fever, chills, contacts with persons with similar rash, or any changes in lotions/soaps/detergents, exposure to animal or plant irritants, and denies swelling or purulent discharge. No recent travel. No recent tick bites. No involvement to palms/soles or between webspaces. Patient does not have history of  immunocompromised. UTD on immunizations.        Past Medical History:  Diagnosis Date  . Asthma     Patient Active Problem List   Diagnosis Date Noted  . Asthma with acute exacerbation 05/24/2017  . Dyspnea 05/24/2017  . Costochondritis, acute 05/24/2017  . Mild persistent asthma without complication 08/15/2016  . Other allergic rhinitis 08/15/2016    Past Surgical History:  Procedure  Laterality Date  . CESAREAN SECTION     x2     OB History    Gravida  1   Para      Term      Preterm      AB      Living        SAB      TAB      Ectopic      Multiple      Live Births              No family history on file.  Social History   Tobacco Use  . Smoking status: Never Smoker  . Smokeless tobacco: Never Used  Vaping Use  . Vaping Use: Never used  Substance Use Topics  . Alcohol use: No  . Drug use: No    Home Medications Prior to Admission medications   Medication Sig Start Date End Date Taking? Authorizing Provider  albuterol (PROVENTIL HFA;VENTOLIN HFA) 108 (90 Base) MCG/ACT inhaler Inhale 1-2 puffs into the lungs every 6 (six) hours as needed for wheezing or shortness of breath. 12/09/16   Tilden Fossa, MD  diphenhydrAMINE (BENADRYL) 25 MG tablet Take 2 tablets (50 mg) every 6 hours as needed for hives. 05/22/20   Molpus, John, MD  fluticasone (FLOVENT HFA) 110 MCG/ACT inhaler Inhale 2 puffs into the lungs 2 (two) times daily. 05/24/17   Bobbitt, Heywood Iles, MD  ipratropium-albuterol (DUONEB) 0.5-2.5 (3) MG/3ML SOLN 1 vial every  4-6 hours as needed. 05/24/17   Bobbitt, Heywood Iles, MD    Allergies    Shellfish allergy  Review of Systems   Review of Systems All other systems are reviewed and are negative for acute change except as noted in the HPI.  Physical Exam Updated Vital Signs BP (!) 110/58 (BP Location: Right Arm)   Pulse 82   Temp 98.9 F (37.2 C) (Oral)   Resp 16   Ht 5\' 2"  (1.575 m)   Wt 107 kg   LMP 04/30/2020   SpO2 100%   BMI 43.16 kg/m   Physical Exam Vitals and nursing note reviewed.  Constitutional:      General: She is not in acute distress.    Appearance: She is not ill-appearing.     Comments: Airway is patent.  She is speaking in full sentences without any respiratory distress.  No angioedema, no swelling noted to tongue or oral mucosa.  Handling secretions without difficulty.  HENT:     Head:  Normocephalic and atraumatic.     Right Ear: Tympanic membrane and external ear normal.     Left Ear: Tympanic membrane and external ear normal.     Nose: Nose normal.     Mouth/Throat:     Mouth: Mucous membranes are moist.     Pharynx: Oropharynx is clear.  Eyes:     General: No scleral icterus.       Right eye: No discharge.        Left eye: No discharge.     Extraocular Movements: Extraocular movements intact.     Conjunctiva/sclera: Conjunctivae normal.     Pupils: Pupils are equal, round, and reactive to light.  Neck:     Vascular: No JVD.  Cardiovascular:     Rate and Rhythm: Normal rate and regular rhythm.     Pulses: Normal pulses.          Radial pulses are 2+ on the right side and 2+ on the left side.     Heart sounds: Normal heart sounds.  Pulmonary:     Breath sounds: No stridor.     Comments: Lungs clear to auscultation in all fields. Symmetric chest rise. No wheezing, rales, or rhonchi.  Normal work of breathing.  Oxygen saturation is 100% on room air. Abdominal:     Comments: Abdomen is soft, non-distended, and non-tender in all quadrants. No rigidity, no guarding. No peritoneal signs.  Musculoskeletal:        General: Normal range of motion.     Cervical back: Normal range of motion.  Skin:    General: Skin is warm and dry.     Capillary Refill: Capillary refill takes less than 2 seconds.     Findings: Rash (Faint maculopapular rash on bilateral arms.. No raised hives on body) present.  Neurological:     Mental Status: She is oriented to person, place, and time.     GCS: GCS eye subscore is 4. GCS verbal subscore is 5. GCS motor subscore is 6.     Comments: Fluent speech, no facial droop.  Sensation grossly intact to light touch in the lower extremities bilaterally. No saddle anesthesias. Strength 5/5 with flexion and extension at the bilateral hips, knees, and ankles. No noted gait deficit. Coordination intact with heel to shin testing.   Psychiatric:          Behavior: Behavior normal.     ED Results / Procedures / Treatments   Labs (all labs ordered are  listed, but only abnormal results are displayed) Labs Reviewed - No data to display  EKG None  Radiology No results found.  Procedures Procedures (including critical care time)  Medications Ordered in ED Medications - No data to display  ED Course  I have reviewed the triage vital signs and the nursing notes.  Pertinent labs & imaging results that were available during my care of the patient were reviewed by me and considered in my medical decision making (see chart for details).    MDM Rules/Calculators/A&P                          History provided by patient with additional history obtained from chart review.  Kerri Patrick is a 24 y.o. female who presents to ED for  pruritic rash. No known triggers or exposures. No evidence of oral swelling or airway compromise.  She has been drinking the bottle of water all waiting in the lobby without any difficulty.  Lungs are clear bilaterally and vitals stable. No respiratory, GI or neurologic symptoms to suggest anaphylaxis. She has been taking benadryl at home, none today. She was prescribed medrol Dosepak by [facility] however has not yet picked it up from pharmacy.  Engaged in shared decision making with patient regarding occasions.  She already has Benadryl and a prescription steroids so she does not want to take anything here in the department.  I feel this is reasonable.  Evaluation does not show pathology that would require ongoing emergent intervention or inpatient treatment. Home care instructions and return precautions discussed. PCP follow up encouraged if symptoms persist. All questions answered.     Portions of this note were generated with Scientist, clinical (histocompatibility and immunogenetics). Dictation errors may occur despite best attempts at proofreading.   Final Clinical Impression(s) / ED Diagnoses Final diagnoses:  Allergic reaction,  subsequent encounter    Rx / DC Orders ED Discharge Orders    None       Kathyrn Lass 05/23/20 1408    Raeford Razor, MD 05/23/20 1640

## 2020-07-22 ENCOUNTER — Emergency Department (HOSPITAL_BASED_OUTPATIENT_CLINIC_OR_DEPARTMENT_OTHER)
Admission: EM | Admit: 2020-07-22 | Discharge: 2020-07-23 | Disposition: A | Discharge status: Home / Self Care | Payer: Self-pay | Attending: Emergency Medicine | Admitting: Emergency Medicine

## 2020-07-22 ENCOUNTER — Other Ambulatory Visit: Payer: Self-pay

## 2020-07-22 ENCOUNTER — Encounter (HOSPITAL_BASED_OUTPATIENT_CLINIC_OR_DEPARTMENT_OTHER): Payer: Self-pay | Admitting: *Deleted

## 2020-07-22 DIAGNOSIS — F419 Anxiety disorder, unspecified: Secondary | ICD-10-CM

## 2020-07-22 DIAGNOSIS — J4531 Mild persistent asthma with (acute) exacerbation: Secondary | ICD-10-CM | POA: Insufficient documentation

## 2020-07-22 DIAGNOSIS — R0602 Shortness of breath: Secondary | ICD-10-CM | POA: Insufficient documentation

## 2020-07-22 DIAGNOSIS — Z760 Encounter for issue of repeat prescription: Secondary | ICD-10-CM

## 2020-07-22 DIAGNOSIS — Z7951 Long term (current) use of inhaled steroids: Secondary | ICD-10-CM | POA: Insufficient documentation

## 2020-07-22 NOTE — ED Triage Notes (Signed)
Brisk walk to rm 1 02 sat 100%, c/o SOB , talking in complete sentences

## 2020-07-23 ENCOUNTER — Emergency Department (HOSPITAL_BASED_OUTPATIENT_CLINIC_OR_DEPARTMENT_OTHER): Payer: Self-pay

## 2020-07-23 ENCOUNTER — Encounter (HOSPITAL_BASED_OUTPATIENT_CLINIC_OR_DEPARTMENT_OTHER): Payer: Self-pay | Admitting: Emergency Medicine

## 2020-07-23 MED ORDER — ALBUTEROL SULFATE HFA 108 (90 BASE) MCG/ACT IN AERS: 1.0000 | INHALATION_SPRAY | Freq: Four times a day (QID) | RESPIRATORY_TRACT | 0 refills | Status: AC | PRN

## 2020-07-23 NOTE — ED Provider Notes (Signed)
[facility] EMERGENCY DEPARTMENT Provider Note   CSN: [id number] Arrival date & time: 07/22/20  2349     History Chief Complaint  Patient presents with  . Shortness of Breath    Kerri Patrick is a 24 y.o. female.  The history is provided by the patient.  Shortness of Breath Severity:  Moderate Onset quality:  Gradual Timing:  Rare Progression:  Resolved Chronicity:  New Context: not URI   Context comment:  Upset about the look of her tongue  Relieved by:  Nothing Worsened by:  Nothing Ineffective treatments:  None tried Associated symptoms: no abdominal pain, no chest pain, no claudication, no cough, no diaphoresis, no ear pain, no fever, no headaches, no hemoptysis, no neck pain, no PND, no rash, no sore throat, no sputum production, no syncope, no swollen glands, no vomiting and no wheezing   Risk factors: no hx of PE/DVT, no prolonged immobilization and no recent surgery   Patient who is upset of the look of her tongue presents with episode of SOB while looking at it.  No CP, no leg pain, no OCP, No travel.       Past Medical History:  Diagnosis Date  . Asthma     Patient Active Problem List   Diagnosis Date Noted  . Asthma with acute exacerbation 05/24/2017  . Dyspnea 05/24/2017  . Costochondritis, acute 05/24/2017  . Mild persistent asthma without complication [id number]  . Other allergic rhinitis 08/15/2016    Past Surgical History:  Procedure Laterality Date  . CESAREAN SECTION     x2     OB History    Gravida  1   Para      Term      Preterm      AB      Living        SAB      TAB      Ectopic      Multiple      Live Births              History reviewed. No pertinent family history.  Social History   Tobacco Use  . Smoking status: Never Smoker  . Smokeless tobacco: Never Used  Vaping Use  . Vaping Use: Never used  Substance Use Topics  . Alcohol use: No  . Drug use: No    Home Medications Prior to  Admission medications   Medication Sig Start Date End Date Taking? Authorizing Provider  albuterol (PROVENTIL HFA;VENTOLIN HFA) 108 (90 Base) MCG/ACT inhaler Inhale 1-2 puffs into the lungs every 6 (six) hours as needed for wheezing or shortness of breath. 12/09/16   Quintella Reichert, MD  diphenhydrAMINE (BENADRYL) 25 MG tablet Take 2 tablets (50 mg) every 6 hours as needed for hives. 05/22/20   Molpus, John, MD  fluticasone (FLOVENT HFA) 110 MCG/ACT inhaler Inhale 2 puffs into the lungs 2 (two) times daily. 05/24/17   Bobbitt, Sedalia Muta, MD  ipratropium-albuterol (DUONEB) 0.5-2.5 (3) MG/3ML SOLN 1 vial every 4-6 hours as needed. 05/24/17   Bobbitt, Sedalia Muta, MD    Allergies    Shellfish allergy  Review of Systems   Review of Systems  Constitutional: Negative for diaphoresis and fever.  HENT: Negative for ear pain and sore throat.   Eyes: Negative for visual disturbance.  Respiratory: Positive for shortness of breath. Negative for cough, hemoptysis, sputum production and wheezing.   Cardiovascular: Negative for chest pain, claudication, syncope and PND.  Gastrointestinal: Negative for abdominal pain  and vomiting.  Genitourinary: Negative for difficulty urinating.  Musculoskeletal: Negative for neck pain.  Skin: Negative for rash.  Neurological: Negative for headaches.  Psychiatric/Behavioral: Negative for agitation.  All other systems reviewed and are negative.   Physical Exam Updated Vital Signs BP 127/73   Pulse 87   Temp 98.5 F (36.9 C) (Oral)   Resp 18   Ht _0  (1.575 m)   Wt 99.8 kg   LMP 06/28/2020   SpO2 99%   BMI 40.24 kg/m   Physical Exam Vitals and nursing note reviewed.  Constitutional:      General: She is not in acute distress.    Appearance: Normal appearance.  HENT:     Head: Normocephalic and atraumatic.     Nose: Nose normal.     Mouth/Throat:     Mouth: Mucous membranes are moist.     Pharynx: Oropharynx is clear. No oropharyngeal exudate.      Comments: Tongue is normal  Eyes:     Conjunctiva/sclera: Conjunctivae normal.     Pupils: Pupils are equal, round, and reactive to light.  Cardiovascular:     Rate and Rhythm: Normal rate and regular rhythm.     Pulses: Normal pulses.     Heart sounds: Normal heart sounds.  Pulmonary:     Effort: Pulmonary effort is normal.     Breath sounds: Normal breath sounds.  Abdominal:     General: Abdomen is flat. Bowel sounds are normal.     Palpations: Abdomen is soft.     Tenderness: There is no abdominal tenderness. There is no guarding.  Musculoskeletal:        General: Normal range of motion.     Cervical back: Normal range of motion and neck supple.  Skin:    General: Skin is warm and dry.     Capillary Refill: Capillary refill takes less than 2 seconds.  Neurological:     General: No focal deficit present.     Mental Status: She is alert and oriented to person, place, and time.     Deep Tendon Reflexes: Reflexes normal.  Psychiatric:        Mood and Affect: Mood is anxious.     ED Results / Procedures / Treatments   Labs (all labs ordered are listed, but only abnormal results are displayed) Labs Reviewed - No data to display  EKG None  Radiology DG Chest Portable 1 View  Result Date: 07/23/2020 CLINICAL DATA:  Shortness of breath. EXAM: PORTABLE CHEST 1 VIEW COMPARISON:  Jan 14, 2019 FINDINGS: The heart size and mediastinal contours are within normal limits. Both lungs are clear. The visualized skeletal structures are unremarkable. IMPRESSION: No active disease. Electronically Signed   By: Constance Holster M.D.   On: 07/23/2020 00:21    Procedures Procedures (including critical care time)  Medications Ordered in ED Medications - No data to display  ED Course  I have reviewed the triage vital signs and the nursing notes.  Pertinent labs & imaging results that were available during my care of the patient were reviewed by me and considered in my medical decision  making (see chart for details).    PERC negative wells 0 highly doubt PE in this low risk patient.  Symptoms resolved and were related to her tongue which is also normal.  She has a myriad of concerns.  I suspect this is all anxiety.  Patient is stable for discharge with close follow up.  Strict return precautions given.  Kemyah Buser was evaluated in Emergency Department on 07/23/2020 for the symptoms described in the history of present illness. She was evaluated in the context of the global COVID-19 pandemic, which necessitated consideration that the patient might be at risk for infection with the SARS-CoV-2 virus that causes COVID-19. Institutional protocols and algorithms that pertain to the evaluation of patients at risk for COVID-19 are in a state of rapid change based on information released by regulatory bodies including the CDC and federal and state organizations. These policies and algorithms were followed during the patient's care in the ED.   Final Clinical Impression(s) / ED Diagnoses Return for intractable cough, coughing up blood,fevers >100.4 unrelieved by medication, shortness of breath, intractable vomiting, chest pain, shortness of breath, weakness,numbness, changes in speech, facial asymmetry,abdominal pain, passing out,Inability to tolerate liquids or food, cough, altered mental status or any concerns. No signs of systemic illness or infection. The patient is nontoxic-appearing on exam and vital signs are within normal limits.   I have reviewed the triage vital signs and the nursing notes. Pertinent labs &imaging results that were available during my care of the patient were reviewed by me and considered in my medical decision making (see chart for details).After history, exam, and medical workup I feel the patient has beenappropriately medically screened and is safe for discharge home. Pertinent diagnoses were discussed with the patient. Patient was given return  precautions.   Kiegan Macaraeg, MD 07/23/20 [id number]

## 2020-08-19 ENCOUNTER — Other Ambulatory Visit: Payer: Self-pay

## 2020-08-19 ENCOUNTER — Encounter (HOSPITAL_BASED_OUTPATIENT_CLINIC_OR_DEPARTMENT_OTHER): Payer: Self-pay

## 2020-08-19 ENCOUNTER — Emergency Department (HOSPITAL_BASED_OUTPATIENT_CLINIC_OR_DEPARTMENT_OTHER)
Admission: EM | Admit: 2020-08-19 | Discharge: 2020-08-19 | Disposition: A | Discharge status: Home / Self Care | Payer: HRSA Program | Attending: Emergency Medicine | Admitting: Emergency Medicine

## 2020-08-19 DIAGNOSIS — U071 COVID-19: Secondary | ICD-10-CM

## 2020-08-19 DIAGNOSIS — J45901 Unspecified asthma with (acute) exacerbation: Secondary | ICD-10-CM | POA: Diagnosis not present

## 2020-08-19 DIAGNOSIS — Z7951 Long term (current) use of inhaled steroids: Secondary | ICD-10-CM | POA: Diagnosis not present

## 2020-08-19 DIAGNOSIS — J101 Influenza due to other identified influenza virus with other respiratory manifestations: Secondary | ICD-10-CM

## 2020-08-19 DIAGNOSIS — R059 Cough, unspecified: Secondary | ICD-10-CM | POA: Diagnosis present

## 2020-08-19 LAB — RESP PANEL BY RT-PCR (FLU A&B, COVID) ARPGX2
Influenza A by PCR: POSITIVE — AB
Influenza B by PCR: NEGATIVE
SARS Coronavirus 2 by RT PCR: POSITIVE — AB

## 2020-08-19 MED ORDER — ACETAMINOPHEN 500 MG PO TABS
1000.0000 mg | ORAL_TABLET | Freq: Once | ORAL | Status: AC
  Administered 2020-08-19: 1000 mg via ORAL
  Filled 2020-08-19: qty 2

## 2020-08-19 MED ORDER — OSELTAMIVIR PHOSPHATE 75 MG PO CAPS: 75.0000 mg | ORAL_CAPSULE | Freq: Two times a day (BID) | ORAL | 0 refills | Status: AC

## 2020-08-19 NOTE — ED Provider Notes (Signed)
[facility] EMERGENCY DEPARTMENT Provider Note   CSN: [id number] Arrival date & time: 08/19/20  1933     History Chief Complaint  Patient presents with  . Cough    Di Kerri Patrick is a 24 y.o. female possible history of asthma who presents for evaluation of cough, generalized body aches, fatigue, subjective fever chills, rhinorrhea that began today.  Patient reports that kids at home have been diagnosed with flu.  She has been around them.  She states she had been feeling fine until this morning when she started having symptoms.  She states she feels achy and sore.  She has not had any vomiting.  She has felt like she has not had any appetite.  She has not been vaccinated for Covid.  She does have a history of asthma and has inhalers that she uses.  She does not feel like she is having any trouble breathing.  The history is provided by the patient.       Past Medical History:  Diagnosis Date  . Asthma     Patient Active Problem List   Diagnosis Date Noted  . Asthma with acute exacerbation 05/24/2017  . Dyspnea 05/24/2017  . Costochondritis, acute 05/24/2017  . Mild persistent asthma without complication 08/15/2016  . Other allergic rhinitis 08/15/2016    Past Surgical History:  Procedure Laterality Date  . CESAREAN SECTION     x2     OB History    Gravida  1   Para      Term      Preterm      AB      Living        SAB      TAB      Ectopic      Multiple      Live Births              No family history on file.  Social History   Tobacco Use  . Smoking status: Never Smoker  . Smokeless tobacco: Never Used  Vaping Use  . Vaping Use: Never used  Substance Use Topics  . Alcohol use: No  . Drug use: No    Home Medications Prior to Admission medications   Medication Sig Start Date End Date Taking? Authorizing Provider  albuterol (VENTOLIN HFA) 108 (90 Base) MCG/ACT inhaler Inhale 1-2 puffs into the lungs every 6 (six) hours as  needed for wheezing or shortness of breath. 07/23/20   Palumbo, April, MD  diphenhydrAMINE (BENADRYL) 25 MG tablet Take 2 tablets (50 mg) every 6 hours as needed for hives. 05/22/20   Molpus, John, MD  fluticasone (FLOVENT HFA) 110 MCG/ACT inhaler Inhale 2 puffs into the lungs 2 (two) times daily. 05/24/17   Bobbitt, Heywood Iles, MD  ipratropium-albuterol (DUONEB) 0.5-2.5 (3) MG/3ML SOLN 1 vial every 4-6 hours as needed. 05/24/17   Bobbitt, Heywood Iles, MD  oseltamivir (TAMIFLU) 75 MG capsule Take 1 capsule (75 mg total) by mouth every 12 (twelve) hours. 08/19/20   Maxwell Caul, PA-C    Allergies    Shellfish allergy  Review of Systems   Review of Systems  Constitutional: Positive for activity change, appetite change, chills, fatigue and fever.  HENT: Positive for congestion and rhinorrhea.   Respiratory: Positive for cough. Negative for shortness of breath.   Cardiovascular: Negative for chest pain.  Gastrointestinal: Negative for abdominal pain, nausea and vomiting.  Genitourinary: Negative for dysuria and hematuria.  Musculoskeletal: Positive for myalgias.  Neurological:  Negative for headaches.  All other systems reviewed and are negative.   Physical Exam Updated Vital Signs BP 115/70   Pulse 98   Temp 99.8 F (37.7 C)   Resp 18   Ht 5\' 2"  (1.575 m)   Wt 107 kg   LMP 07/29/2020   SpO2 99%   BMI 43.16 kg/m   Physical Exam Vitals and nursing note reviewed.  Constitutional:      Appearance: Normal appearance. She is well-developed.  HENT:     Head: Normocephalic and atraumatic.     Mouth/Throat:     Comments: Posterior oropharynx is clear.  Eyes:     General: Lids are normal.     Conjunctiva/sclera: Conjunctivae normal.     Pupils: Pupils are equal, round, and reactive to light.  Cardiovascular:     Rate and Rhythm: Normal rate and regular rhythm.     Pulses: Normal pulses.     Heart sounds: Normal heart sounds. No murmur heard.  No friction rub. No gallop.    Pulmonary:     Effort: Pulmonary effort is normal.     Breath sounds: Normal breath sounds.     Comments: Lungs clear to auscultation bilaterally.  Symmetric chest rise.  No wheezing, rales, rhonchi.  Able speak in full sentences without any difficulty.  No evidence of respiratory distress. Abdominal:     Palpations: Abdomen is soft. Abdomen is not rigid.     Tenderness: There is no abdominal tenderness. There is no guarding.     Comments: Abdomen is soft, non-distended, non-tender. No rigidity, No guarding. No peritoneal signs.  Musculoskeletal:        General: Normal range of motion.     Cervical back: Full passive range of motion without pain.  Skin:    General: Skin is warm and dry.     Capillary Refill: Capillary refill takes less than 2 seconds.  Neurological:     Mental Status: She is alert and oriented to person, place, and time.  Psychiatric:        Speech: Speech normal.     ED Results / Procedures / Treatments   Labs (all labs ordered are listed, but only abnormal results are displayed) Labs Reviewed  RESP PANEL BY RT-PCR (FLU A&B, COVID) ARPGX2 - Abnormal; Notable for the following components:      Result Value   SARS Coronavirus 2 by RT PCR POSITIVE (*)    Influenza A by PCR POSITIVE (*)    All other components within normal limits    EKG None  Radiology No results found.  Procedures Procedures (including critical care time)  Medications Ordered in ED Medications  acetaminophen (TYLENOL) tablet 1,000 mg (1,000 mg Oral Given 08/19/20 2204)    ED Course  I have reviewed the triage vital signs and the nursing notes.  Pertinent labs & imaging results that were available during my care of the patient were reviewed by me and considered in my medical decision making (see chart for details).    MDM Rules/Calculators/A&P                          24 year old female who presents for evaluation of subjective fever chills, body aches, nasal congestion,  rhinorrhea, fatigue that began this morning.  Kids at home have had influenza.  She has not been vaccinated for Covid.  On initial arrival, she has low-grade fever.  Vitals otherwise stable.  No evidence of hypoxia.  On  exam, no evidence of respiratory distress.  No wheezing.  She does have a history of asthma.  Concern for viral process such as influenza versus COVID-19.  We will plan for Covid/flu test.  Patient is positive for flu and Covid.  Given that her symptoms started today, she is a candidate for Tamiflu.  We will prescribe it.  Additionally, given her history of asthma, we will refer her to monoclonal antibody infusion.  Discussed results with patient.  She is agreeable.  At this time, patient is hemodynamically stable. At this time, patient exhibits no emergent life-threatening condition that require further evaluation in ED. Patient had ample opportunity for questions and discussion. All patient's questions were answered with full understanding. Strict return precautions discussed. Patient expresses understanding and agreement to plan.   Portions of this note were generated with Scientist, clinical (histocompatibility and immunogenetics). Dictation errors may occur despite best attempts at proofreading.   Shalva Rozycki was evaluated in Emergency Department on 08/19/2020 for the symptoms described in the history of present illness. She was evaluated in the context of the global COVID-19 pandemic, which necessitated consideration that the patient might be at risk for infection with the SARS-CoV-2 virus that causes COVID-19. Institutional protocols and algorithms that pertain to the evaluation of patients at risk for COVID-19 are in a state of rapid change based on information released by regulatory bodies including the CDC and federal and state organizations. These policies and algorithms were followed during the patient's care in the ED.  Final Clinical Impression(s) / ED Diagnoses Final diagnoses:  COVID-19  Influenza A     Rx / DC Orders ED Discharge Orders         Ordered    oseltamivir (TAMIFLU) 75 MG capsule  Every 12 hours        08/19/20 2209           Rosana Hoes 08/19/20 2222    Pollyann Savoy, MD 08/19/20 2224

## 2020-08-19 NOTE — Discharge Instructions (Addendum)
As we discussed, you tested positive for Covid and influenza today.  Given that your symptoms started today, you are eligible for Tamiflu which I prescribed.  I have also provided you with a referral to the monoclonal antibody infusion clinic.  They will contact you to see if you are a candidate.  You can take Tylenol or Ibuprofen as directed for pain. You can alternate Tylenol and Ibuprofen every 4 hours. If you take Tylenol at 1pm, then you can take Ibuprofen at 5pm. Then you can take Tylenol again at 9pm.   Make sure you are drinking plenty of fluids and staying hydrated.  Return emergency department for any worsening difficulty breathing, vomiting, inability to keep anything down, chest pain or any other worsening concerning symptoms.

## 2020-08-19 NOTE — ED Triage Notes (Signed)
Pt c/o flu like sx x today-NAD-steady gait

## 2020-08-20 ENCOUNTER — Other Ambulatory Visit: Payer: Self-pay | Admitting: Physician Assistant

## 2020-08-20 DIAGNOSIS — J45901 Unspecified asthma with (acute) exacerbation: Secondary | ICD-10-CM

## 2020-08-20 DIAGNOSIS — U071 COVID-19: Secondary | ICD-10-CM

## 2020-08-20 NOTE — Progress Notes (Signed)
I connected by phone with Kerri Patrick on 08/20/2020 at 8:18 AM to discuss the potential use of a new treatment for mild to moderate COVID-19 viral infection in non-hospitalized patients.  This patient is a 24 y.o. female that meets the FDA criteria for Emergency Use Authorization of COVID monoclonal antibody sotrovimab, casirivimab/imdevimab or bamlamivimab/estevimab.  Has a (+) direct SARS-CoV-2 viral test result  Has mild or moderate COVID-19   Is NOT hospitalized due to COVID-19  Is within 10 days of symptom onset  Has at least one of the high risk factor(s) for progression to severe COVID-19 and/or hospitalization as defined in EUA.  Specific high risk criteria : BMI > 25, Chronic Lung Disease and Other high risk medical condition per CDC:  high SVI   I have spoken and communicated the following to the patient or parent/caregiver regarding COVID monoclonal antibody treatment:  1. FDA has authorized the emergency use for the treatment of mild to moderate COVID-19 in adults and pediatric patients with positive results of direct SARS-CoV-2 viral testing who are 36 years of age and older weighing at least 40 kg, and who are at high risk for progressing to severe COVID-19 and/or hospitalization.  2. The significant known and potential risks and benefits of COVID monoclonal antibody, and the extent to which such potential risks and benefits are unknown.  3. Information on available alternative treatments and the risks and benefits of those alternatives, including clinical trials.  4. Patients treated with COVID monoclonal antibody should continue to self-isolate and use infection control measures (e.g., wear mask, isolate, social distance, avoid sharing personal items, clean and disinfect "high touch" surfaces, and frequent handwashing) according to CDC guidelines.   5. The patient or parent/caregiver has the option to accept or refuse COVID monoclonal antibody treatment.  After  reviewing this information with the patient, the patient has agreed to receive one of the available covid 19 monoclonal antibodies and will be provided an appropriate fact sheet prior to infusion.  Sx onset 12/8. Set up for infusion on 12/10 @ 12:30pm. Directions given to [facility]. Pt is aware that insurance will be charged an infusion fee. She has no medical insurance. Pt is unvaccinated. + in epic. Also tested + for flu.  Cline Crock 08/20/2020 8:18 AM

## 2020-08-21 ENCOUNTER — Ambulatory Visit (HOSPITAL_COMMUNITY): Payer: Self-pay

## 2021-01-16 ENCOUNTER — Encounter (HOSPITAL_BASED_OUTPATIENT_CLINIC_OR_DEPARTMENT_OTHER): Payer: Self-pay

## 2021-01-16 ENCOUNTER — Emergency Department (HOSPITAL_BASED_OUTPATIENT_CLINIC_OR_DEPARTMENT_OTHER)
Admission: EM | Admit: 2021-01-16 | Discharge: 2021-01-16 | Disposition: A | Discharge status: Home / Self Care | Payer: Medicaid Other | Attending: Emergency Medicine | Admitting: Emergency Medicine

## 2021-01-16 ENCOUNTER — Other Ambulatory Visit: Payer: Self-pay

## 2021-01-16 DIAGNOSIS — J45901 Unspecified asthma with (acute) exacerbation: Secondary | ICD-10-CM | POA: Diagnosis not present

## 2021-01-16 DIAGNOSIS — R42 Dizziness and giddiness: Secondary | ICD-10-CM | POA: Insufficient documentation

## 2021-01-16 DIAGNOSIS — O26891 Other specified pregnancy related conditions, first trimester: Secondary | ICD-10-CM | POA: Insufficient documentation

## 2021-01-16 DIAGNOSIS — Z3A01 Less than 8 weeks gestation of pregnancy: Secondary | ICD-10-CM | POA: Insufficient documentation

## 2021-01-16 LAB — BASIC METABOLIC PANEL
Anion gap: 7 (ref 5–15)
BUN: 9 mg/dL (ref 6–20)
CO2: 24 mmol/L (ref 22–32)
Calcium: 8.7 mg/dL — ABNORMAL LOW (ref 8.9–10.3)
Chloride: 103 mmol/L (ref 98–111)
Creatinine, Ser: 0.73 mg/dL (ref 0.44–1.00)
GFR, Estimated: >60 mL/min (ref >60)
Glucose, Bld: 101 mg/dL — ABNORMAL HIGH (ref 70–99)
Potassium: 3.5 mmol/L (ref 3.5–5.1)
Sodium: 134 mmol/L — ABNORMAL LOW (ref 135–145)

## 2021-01-16 LAB — CBC
HCT: 40.5 % (ref 36.0–46.0)
Hemoglobin: 14.1 g/dL (ref 12.0–15.0)
MCH: 29.9 pg (ref 26.0–34.0)
MCHC: 34.8 g/dL (ref 30.0–36.0)
MCV: 86 fL (ref 80.0–100.0)
Platelets: 215 10*3/uL (ref 150–400)
RBC: 4.71 MIL/uL (ref 3.87–5.11)
RDW: 12 % (ref 11.5–15.5)
WBC: 11.5 10*3/uL — ABNORMAL HIGH (ref 4.0–10.5)
nRBC: 0 % (ref 0.0–0.2)

## 2021-01-16 MED ORDER — SODIUM CHLORIDE 0.9 % IV SOLN: 1000.0000 mL | INTRAVENOUS | Status: DC

## 2021-01-16 MED ORDER — SODIUM CHLORIDE 0.9 % IV BOLUS (SEPSIS)
1000.0000 mL | Freq: Once | INTRAVENOUS | Status: AC
  Administered 2021-01-16: 1000 mL via INTRAVENOUS

## 2021-01-16 NOTE — ED Provider Notes (Signed)
[facility] EMERGENCY DEPARTMENT Provider Note   CSN: [id number] Arrival date & time: 01/16/21  1250     History Chief Complaint  Patient presents with  . Dizziness    Kerri Patrick is a 25 y.o. female.  HPI   Patient presented to the ED for evaluation of lightheadedness.  Patient states his symptoms started yesterday.  She has been feeling lightheaded as if she is going to pass out.  Does not seem to get worse when she is standing up though.  Not having any room spinning sensation.  She tried drinking some fluids but when she woke up this morning she continues to feel the same way.  She denies any vomiting or diarrhea.  No chest pain or shortness of breath.  No vaginal bleeding.  Patient is currently [redacted] weeks pregnant.  Past Medical History:  Diagnosis Date  . Asthma     Patient Active Problem List   Diagnosis Date Noted  . Asthma with acute exacerbation 05/24/2017  . Dyspnea 05/24/2017  . Costochondritis, acute 05/24/2017  . Mild persistent asthma without complication 08/15/2016  . Other allergic rhinitis 08/15/2016    Past Surgical History:  Procedure Laterality Date  . CESAREAN SECTION     x2     OB History    Gravida  2   Para      Term      Preterm      AB      Living        SAB      IAB      Ectopic      Multiple      Live Births              No family history on file.  Social History   Tobacco Use  . Smoking status: Never Smoker  . Smokeless tobacco: Never Used  Vaping Use  . Vaping Use: Never used  Substance Use Topics  . Alcohol use: No  . Drug use: No    Home Medications Prior to Admission medications   Medication Sig Start Date End Date Taking? Authorizing Provider  albuterol (VENTOLIN HFA) 108 (90 Base) MCG/ACT inhaler Inhale 1-2 puffs into the lungs every 6 (six) hours as needed for wheezing or shortness of breath. 07/23/20   Palumbo, April, MD  diphenhydrAMINE (BENADRYL) 25 MG tablet Take 2 tablets (50  mg) every 6 hours as needed for hives. 05/22/20   Molpus, John, MD  fluticasone (FLOVENT HFA) 110 MCG/ACT inhaler Inhale 2 puffs into the lungs 2 (two) times daily. 05/24/17   Bobbitt, Heywood Iles, MD  ipratropium-albuterol (DUONEB) 0.5-2.5 (3) MG/3ML SOLN 1 vial every 4-6 hours as needed. 05/24/17   Bobbitt, Heywood Iles, MD  oseltamivir (TAMIFLU) 75 MG capsule Take 1 capsule (75 mg total) by mouth every 12 (twelve) hours. 08/19/20   Maxwell Caul, PA-C    Allergies    Shellfish allergy  Review of Systems   Review of Systems  All other systems reviewed and are negative.   Physical Exam Updated Vital Signs BP 108/67   Pulse 81   Temp 98.2 F (36.8 C) (Oral)   Resp 15   Ht 1.575 m (5\' 2" )   Wt 104.3 kg   LMP 07/29/2020   SpO2 100%   BMI 42.07 kg/m   Physical Exam Vitals and nursing note reviewed.  Constitutional:      General: She is not in acute distress.    Appearance: She is well-developed.  HENT:     Head: Normocephalic and atraumatic.     Right Ear: External ear normal.     Left Ear: External ear normal.  Eyes:     General: No scleral icterus.       Right eye: No discharge.        Left eye: No discharge.     Conjunctiva/sclera: Conjunctivae normal.  Neck:     Trachea: No tracheal deviation.  Cardiovascular:     Rate and Rhythm: Normal rate and regular rhythm.  Pulmonary:     Effort: Pulmonary effort is normal. No respiratory distress.     Breath sounds: Normal breath sounds. No stridor. No wheezing or rales.  Abdominal:     General: Bowel sounds are normal. There is no distension.     Palpations: Abdomen is soft.     Tenderness: There is no abdominal tenderness. There is no guarding or rebound.  Musculoskeletal:        General: No tenderness.     Cervical back: Neck supple.  Skin:    General: Skin is warm and dry.     Findings: No rash.  Neurological:     Mental Status: She is alert.     Cranial Nerves: No cranial nerve deficit (no facial droop,  extraocular movements intact, no slurred speech).     Sensory: No sensory deficit.     Motor: No abnormal muscle tone or seizure activity.     Coordination: Coordination normal.     ED Results / Procedures / Treatments   Labs (all labs ordered are listed, but only abnormal results are displayed) Labs Reviewed  CBC - Abnormal; Notable for the following components:      Result Value   WBC 11.5 (*)    All other components within normal limits  BASIC METABOLIC PANEL - Abnormal; Notable for the following components:   Sodium 134 (*)    Glucose, Bld 101 (*)    Calcium 8.7 (*)    All other components within normal limits    EKG EKG Interpretation  Date/Time:  Saturday Jan 16 2021 13:21:40 EDT Ventricular Rate:  74 PR Interval:  154 QRS Duration: 87 QT Interval:  389 QTC Calculation: 432 R Axis:   16 Text Interpretation: Sinus rhythm Low voltage, precordial leads No significant change since last tracing Confirmed by Linwood Dibbles [phone]) on 01/16/2021 1:24:05 PM   Radiology No results found.  Procedures Procedures   Medications Ordered in ED Medications  sodium chloride 0.9 % bolus 1,000 mL (1,000 mLs Intravenous New Bag/Given 01/16/21 1331)    Followed by  0.9 %  sodium chloride infusion (has no administration in time range)    ED Course  I have reviewed the triage vital signs and the nursing notes.  Pertinent labs & imaging results that were available during my care of the patient were reviewed by me and considered in my medical decision making (see chart for details).  Clinical Course as of 01/16/21 1430  Sat Jan 16, 2021  1415 CBC with slight elevation white blood cell count but I do not think that is clinically significant.  Metabolic panel shows a sodium of 134 glucose of 101 and calcium 8.7 but I do not think those are clinically significant [JK]    Clinical Course User Index [JK] Linwood Dibbles, MD   MDM Rules/Calculators/A&P                         Patient  presented to the ED with complaints of lightheadedness.  She is not having any focal neurologic deficits.  She is not describing room spinning sensation or difficulty with her balance.  I doubt vertigo.  Symptoms are suggestive of possible orthostatic hypotension associated with her pregnancy although she is not having any clear positional appointment.  ED work-up here was otherwise reassuring.  She is not anemic.  She is not dehydrated.  She was concerned about her iron levels as they were low on her prior pregnancy but I told her that her blood count today is normal.  Patient was given a liter of IV fluids.  She is feeling well.  Stable for outpatient follow-up with her OB/GYN  Final Clinical Impression(s) / ED Diagnoses Final diagnoses:  Lightheadedness    Rx / DC Orders ED Discharge Orders    None       Linwood Dibbles, MD 01/16/21 1430

## 2021-01-16 NOTE — Discharge Instructions (Signed)
Make sure to drink plenty of fluids.  Your blood tests and EKG today were reassuring.  Return to the ED for trouble with fevers, no vaginal bleeding.  Follow-up with your OB/GYN doctor if symptoms persist

## 2021-01-16 NOTE — ED Triage Notes (Signed)
Pt reports dizziness starting yesterday, drank water, went to sleep and woke up feeling the same. Denies N/V.

## 2021-05-08 IMAGING — DX DG CHEST 1V PORT
1 series · 1 of 1 positions shown · non-contrast
Comparison: January 14, 2019

CLINICAL DATA: Shortness of breath.

EXAM:
PORTABLE CHEST 1 VIEW

[chest ap]
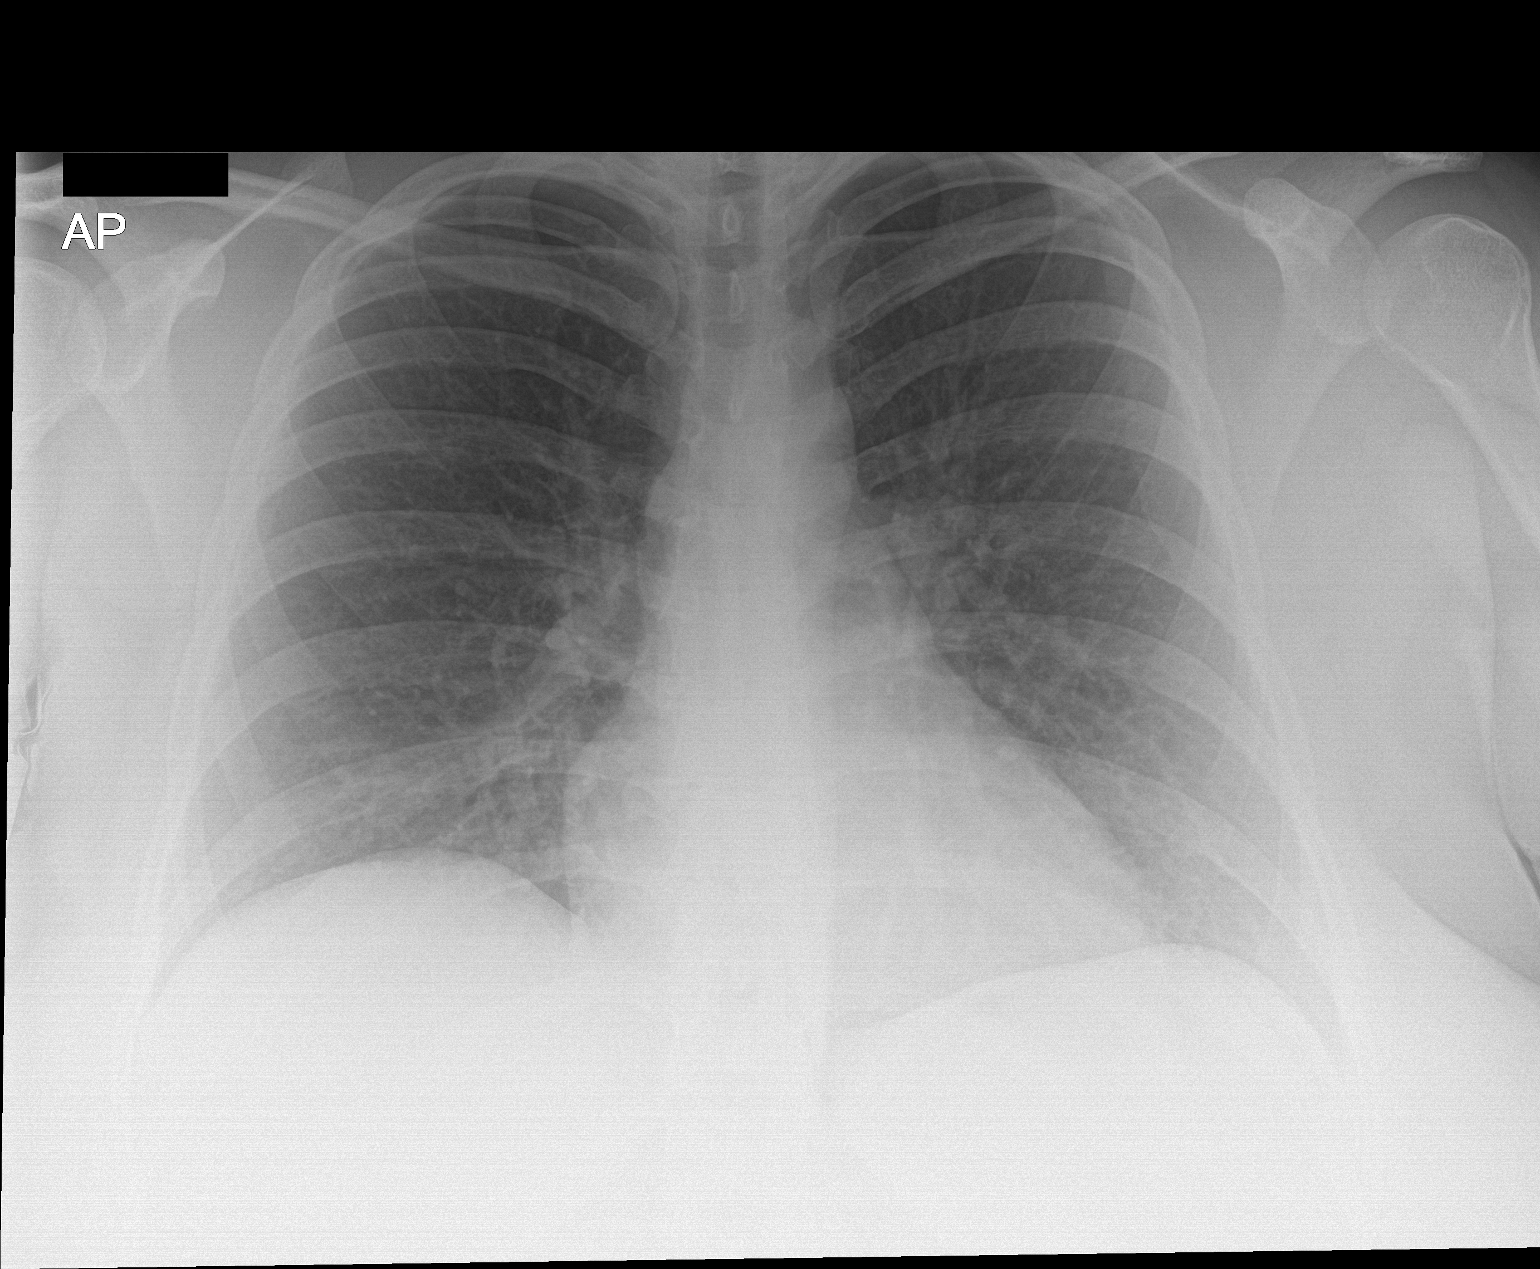

[1 of 1 positions shown; findings below may reference images not displayed]

FINDINGS: The heart size and mediastinal contours are within normal limits.
Both lungs are clear. The visualized skeletal structures are
unremarkable.
IMPRESSION: No active disease.

## 2024-09-27 NOTE — Progress Notes (Signed)
 GENERAL SURGERY RETURN VISIT  Patient: Kerri Patrick MRN: [id number] Date: 10/01/2024  CHIEF COMPLAINT: Cholelithiasis/biliary hyperkinesia  HISTORY OF PRESENT ILLNESS:  Kerri Patrick is a 29 y.o. female with a history of asthma who returns to clinic today for evaluation of cholelithiasis and biliary hyperkinesia.  A RUQ US  was performed on 07/04/15 which revealed cholelithiasis without any evidence of cholecystitis and a CBD of 4-5 mm.  A HIDA was performed on 10/19/23 which reveled an EF of 94%.  She was seen in clinic for this about 10 months ago and was scheduled for cholecystectomy although this was canceled due to job restrictions.  She continues to notice feeling bloated.  She denies any abdominal pain, nausea or vomiting.  She does notice some episodes of back pain though and she is unsure if this is related to her gallbladder.  MEDS: Current Rx ordered in [facility][1]  PROBLEM LIST: Problem List[2]   ALLERGIES: Allergies[3]   PSH: Surgical History[4]  PMH: Medical History[5]   SOCIAL HISTORY: Social History   Socioeconomic History   Marital status: Single    Spouse name: Not on file   Number of children: Not on file   Years of education: Not on file   Highest education level: Not on file  Occupational History   Not on file  Tobacco Use   Smoking status: Never   Smokeless tobacco: Never  Vaping Use   Vaping status: Never Used  Substance and Sexual Activity   Alcohol use: Not Currently   Drug use: Never    Comment: Drug use: Drug Use: Not Currently; denies   Sexual activity: Not on file  Other Topics Concern   Not on file  Social History Narrative   Not on file   Social Drivers of Health   Living Situation: Low Risk (05/19/2024)   Living Situation    What is your living situation today?: I have a steady place to live    Think about the place you live. Do you have problems with any of the following? Choose all that apply:: None/None on  this list  Food Insecurity: Low Risk (05/19/2024)   Food vital sign    Within the past 12 months, you worried that your food would run out before you got money to buy more: Never true    Within the past 12 months, the food you bought just didn't last and you didn't have money to get more: Never true  Transportation Needs: No Transportation Needs (05/19/2024)   Transportation    In the past 12 months, has lack of reliable transportation kept you from medical appointments, meetings, work or from getting things needed for daily living? : No  Utilities: Low Risk (05/19/2024)   Utilities    In the past 12 months has the electric, gas, oil, or water company threatened to shut off services in your home? : No  Safety: Not At Risk (03/05/2023)   Received from [facility]   HITS    Over the last 12 months how often did your partner physically hurt you?: Never    Over the last 12 months how often did your partner insult you or talk down to you?: Never    Over the last 12 months how often did your partner threaten you with physical harm?: Never    Over the last 12 months how often did your partner scream or curse at you?: Never  Alcohol Screening: Not At Risk (05/21/2023)   Alcohol    Audit  C Alcohol risk score: 0  Tobacco Use: Low Risk (10/01/2024)   Patient History    Smoking Tobacco Use: Never    Smokeless Tobacco Use: Never    Passive Exposure: Not on file  Depression: Not at risk (02/14/2022)   Received from [facility] visits prior to 11/12/2022.   PHQ-2    SDOH PHQ2 SCORE: 0  Social Connections: Unknown (01/24/2022)   Received from [facility]   Social Network    Social Network: Not on file  Financial Resource Strain: Not on file    FAMILY HISTORY: Family History[6]  ROS: A complete point review of systems was negative except for as stated in the HPI.  PHYSICAL EXAMINATION: VITAL SIGNS: BP 113/79 (BP Location: Left arm, Patient Position: Sitting)    Pulse 72   Temp 98 F (36.7 C) (Temporal)   Ht 1.575 m (5' 2)   Wt 108 kg (239 lb)   SpO2 99%   BMI 43.71 kg/m   Gen: pt sitting in chair in NAD HEENT: atraumatic, EOMI, pupils equal and round CV: regular rate and rhythm Pulm: normal work of breathing on room air Abd: soft, non-tender, non-distended, no rebound tenderness or guarding Ext: warm, well perfused, moves all extremities Neuro: GCS 15, no gross deficits Skin: no rashes, no lesions Psych: normal judgement, normal mood  IMAGING:  RUQ US  (07/04/15): Cholelithiasis and gallbladder sludge without findings of acute cholecystitis.    HIDA (10/19/23): 1. Rapid contraction of the gallbladder with high ejection fraction. Findings could indicate biliary hyperkinesis. 2. Patent cystic duct and common bile duct.   I have personally reviewed the imaging report.  LABS:  05/19/24 AST/ALT: 19/19 Tbili: 0.6 Alk phos: 74 WBC: 7.38  Albumin: 4.4  I have reviewed the above labs and they are used to help determine operative planning for the patient including the decision to perform a cholangiogram and if the patient has cholecystitis.  They are also used to help determine operative risk for the patient  ASSESSMENT/PLAN:  29 y.o. female with cholelithiasis and biliary hyperkinesia.  We discussed that she is minimally symptomatic now so it would be reasonable to monitor her symptoms vs proceeding forward with cholecystectomy given her history of symptoms.  After our discussion, she would like to proceed forward with lap chole.  - OR for lap chole on 12/23/24, informed consent obtained in clinic - PAC visit to be arranged   It was discussed with the patient that these facilities include a teaching institution and that residents and fellows who are physicians in graduate medical training, non-physician practitioners including physician assistants and nurse practitioners, and students in training to be physicians may assist in their care,  which includes performing portions of the operation under my supervision.  I will be present for all critical portions of the operation.    Electronically signed by: Prentice Tanda Pizza, MD 10/01/2024 10:18 AM         [1] No current Epic-ordered outpatient medications on file.   No current Epic-ordered facility-administered medications on file.  [2] Patient Active Problem List Diagnosis   Mild intermittent asthma without complication   Hx of cesarean section   History of cervical cerclage   Cervical incompetence   Class 3 severe obesity in adult (CMD)   History of preterm premature rupture of membranes (PPROM)   SVD (spontaneous vaginal delivery)   Cholelithiasis  [3] Allergies Allergen Reactions   Shellfish Containing Products Hives and Other (See Comments)    From  allergy testing- red and blotching around injection site  3-4 months ago  From allergy testing- red and blotching around injection site  3-4 months ago  From allergy testing- red and blotching around injection site  3-4 months ago   Shrimp Other (See Comments)    From allergy testing- red and blotching around injection site, 3-4 months ago  [4] Past Surgical History: Procedure Laterality Date   CERVICAL CERCLAGE N/A 10/18/2021   Procedure: CERVICAL CERCLAGE;  Surgeon: Donnice Lynwood Lauth, MD;  Location: [facility] OB OR;  Service: Obstetrics Daniel;  Laterality: N/A;   CESAREAN SECTION, UNSPECIFIED     Procedure: CESAREAN SECTION  [5] Past Medical History: Diagnosis Date   Asthma (CMD)    Dental disease    Gallstones    GERD (gastroesophageal reflux disease)    Obesity   [6] Family History Problem Relation Name Age of Onset   Diabetes Mother Bascom Croissant    Asthma Mother Bascom Croissant    Hypertension Maternal Grandfather Darrel Cleatus    Stroke Maternal Grandfather Darrel Cleatus    Diabetes Maternal Grandfather Darrel Cleatus    Anesthesia problems Neg Hx     Malig Hyperthermia Neg  Hx
